# Patient Record
Sex: Female | Born: 1955 | Race: White | Hispanic: No | Marital: Married | Smoking: Never smoker
Health system: Southern US, Community
[De-identification: ages and names within clinical notes are randomized; demographics above are authoritative.]

## PROBLEM LIST (undated history)

## (undated) DIAGNOSIS — E785 Hyperlipidemia, unspecified: Secondary | ICD-10-CM

## (undated) DIAGNOSIS — K449 Diaphragmatic hernia without obstruction or gangrene: Secondary | ICD-10-CM

## (undated) HISTORY — DX: Hyperlipidemia, unspecified: E78.5

## (undated) HISTORY — DX: Diaphragmatic hernia without obstruction or gangrene: K44.9

## (undated) HISTORY — PX: BREAST CYST ASPIRATION: SHX578

---

## 1996-11-17 HISTORY — PX: REDUCTION MAMMAPLASTY: SUR839

## 2004-11-17 HISTORY — PX: ABDOMINAL HYSTERECTOMY: SHX81

## 2005-05-14 ENCOUNTER — Ambulatory Visit: Payer: Self-pay | Admitting: Family Medicine

## 2006-02-11 ENCOUNTER — Other Ambulatory Visit: Payer: Self-pay

## 2006-02-16 ENCOUNTER — Inpatient Hospital Stay: Payer: Self-pay | Admitting: Obstetrics and Gynecology

## 2006-05-21 ENCOUNTER — Ambulatory Visit: Payer: Self-pay | Admitting: Family Medicine

## 2006-09-18 ENCOUNTER — Ambulatory Visit: Payer: Self-pay | Admitting: Gastroenterology

## 2007-05-25 ENCOUNTER — Ambulatory Visit: Payer: Self-pay | Admitting: Obstetrics and Gynecology

## 2007-10-12 ENCOUNTER — Ambulatory Visit: Payer: Self-pay | Admitting: Internal Medicine

## 2008-06-07 ENCOUNTER — Ambulatory Visit: Payer: Self-pay | Admitting: Obstetrics and Gynecology

## 2009-09-24 ENCOUNTER — Ambulatory Visit: Payer: Self-pay | Admitting: Internal Medicine

## 2009-10-02 ENCOUNTER — Ambulatory Visit: Payer: Self-pay | Admitting: Internal Medicine

## 2009-12-20 ENCOUNTER — Ambulatory Visit: Payer: Self-pay | Admitting: Internal Medicine

## 2009-12-25 ENCOUNTER — Ambulatory Visit: Payer: Self-pay

## 2010-09-04 ENCOUNTER — Ambulatory Visit: Payer: Self-pay | Admitting: Internal Medicine

## 2010-09-17 ENCOUNTER — Ambulatory Visit: Payer: Self-pay | Admitting: Internal Medicine

## 2010-10-17 ENCOUNTER — Ambulatory Visit: Payer: Self-pay | Admitting: Internal Medicine

## 2011-01-22 ENCOUNTER — Ambulatory Visit: Payer: Self-pay | Admitting: Obstetrics and Gynecology

## 2012-01-26 ENCOUNTER — Ambulatory Visit: Payer: Self-pay

## 2012-03-04 ENCOUNTER — Ambulatory Visit: Payer: Self-pay | Admitting: Gastroenterology

## 2013-02-16 ENCOUNTER — Ambulatory Visit: Payer: Self-pay

## 2014-02-27 ENCOUNTER — Ambulatory Visit: Payer: Self-pay

## 2015-03-01 ENCOUNTER — Ambulatory Visit
Admit: 2015-03-01 | Disposition: A | Discharge status: Home / Self Care | Payer: Self-pay | Attending: Obstetrics and Gynecology | Admitting: Obstetrics and Gynecology

## 2016-03-03 ENCOUNTER — Other Ambulatory Visit: Payer: Self-pay | Admitting: Obstetrics and Gynecology

## 2016-03-03 DIAGNOSIS — Z1231 Encounter for screening mammogram for malignant neoplasm of breast: Secondary | ICD-10-CM

## 2016-03-07 ENCOUNTER — Ambulatory Visit
Admission: RE | Admit: 2016-03-07 | Discharge: 2016-03-07 | Disposition: A | Discharge status: Home / Self Care | Payer: BC Managed Care – PPO | Source: Ambulatory Visit | Attending: Obstetrics and Gynecology | Admitting: Obstetrics and Gynecology

## 2016-03-07 DIAGNOSIS — Z1231 Encounter for screening mammogram for malignant neoplasm of breast: Secondary | ICD-10-CM | POA: Insufficient documentation

## 2016-03-12 ENCOUNTER — Other Ambulatory Visit: Payer: Self-pay | Admitting: Obstetrics and Gynecology

## 2016-03-12 DIAGNOSIS — R928 Other abnormal and inconclusive findings on diagnostic imaging of breast: Secondary | ICD-10-CM

## 2016-03-18 ENCOUNTER — Ambulatory Visit
Admission: RE | Admit: 2016-03-18 | Discharge: 2016-03-18 | Disposition: A | Discharge status: Home / Self Care | Payer: BC Managed Care – PPO | Source: Ambulatory Visit | Attending: Obstetrics and Gynecology | Admitting: Obstetrics and Gynecology

## 2016-03-18 DIAGNOSIS — R928 Other abnormal and inconclusive findings on diagnostic imaging of breast: Secondary | ICD-10-CM | POA: Insufficient documentation

## 2017-05-07 ENCOUNTER — Other Ambulatory Visit: Payer: Self-pay | Admitting: Obstetrics and Gynecology

## 2017-05-07 ENCOUNTER — Other Ambulatory Visit: Payer: Self-pay | Admitting: Family Medicine

## 2017-05-07 DIAGNOSIS — Z1231 Encounter for screening mammogram for malignant neoplasm of breast: Secondary | ICD-10-CM

## 2017-05-27 ENCOUNTER — Ambulatory Visit
Admission: RE | Admit: 2017-05-27 | Discharge: 2017-05-27 | Disposition: A | Discharge status: Home / Self Care | Payer: BC Managed Care – PPO | Source: Ambulatory Visit | Attending: Obstetrics and Gynecology | Admitting: Obstetrics and Gynecology

## 2017-05-27 DIAGNOSIS — Z1231 Encounter for screening mammogram for malignant neoplasm of breast: Secondary | ICD-10-CM | POA: Diagnosis present

## 2018-07-02 ENCOUNTER — Other Ambulatory Visit: Payer: Self-pay | Admitting: Internal Medicine

## 2018-07-02 DIAGNOSIS — Z1231 Encounter for screening mammogram for malignant neoplasm of breast: Secondary | ICD-10-CM

## 2018-07-06 ENCOUNTER — Encounter: Payer: Self-pay | Admitting: Dietician

## 2018-07-06 ENCOUNTER — Encounter: Payer: BC Managed Care – PPO | Attending: "Endocrinology | Admitting: Dietician

## 2018-07-06 VITALS — BP 106/68 | Ht 64.0 in | Wt 137.8 lb

## 2018-07-06 DIAGNOSIS — E109 Type 1 diabetes mellitus without complications: Secondary | ICD-10-CM

## 2018-07-06 DIAGNOSIS — Z713 Dietary counseling and surveillance: Secondary | ICD-10-CM | POA: Insufficient documentation

## 2018-07-06 DIAGNOSIS — E1065 Type 1 diabetes mellitus with hyperglycemia: Secondary | ICD-10-CM | POA: Diagnosis not present

## 2018-07-06 NOTE — Progress Notes (Signed)
Diabetes Self-Management Education  Visit Type: First/Initial  Appt. Start Time: 1315 Appt. End Time: 1445  07/06/2018  [name], identified by name and date of birth, is a 62 y.o. female with a diagnosis of Diabetes: Type 1.   ASSESSMENT  Blood pressure 106/68, height 5\' 4"  (1.626 m), weight 137 lb 12.8 oz (62.5 kg). Body mass index is 23.65 kg/m.  Diabetes Self-Management Education - 07/06/18 1519      Visit Information   Visit Type  First/Initial      Initial Visit   Diabetes Type  Type 1      Health Coping   How would you rate your overall health?  Poor      Psychosocial Assessment   Patient Belief/Attitude about Diabetes  Motivated to manage diabetes    Self-care barriers  None    Self-management support  Doctor's office;Family    Other persons present  Patient    Patient Concerns  Glycemic Control;Medication   prevent complications   Special Needs  None    Preferred Learning Style  Visual;Auditory    What is the last grade level you completed in school?  college      Pre-Education Assessment   Patient understands the diabetes disease and treatment process.  Demonstrates understanding / competency    Patient understands incorporating nutritional management into lifestyle.  Needs Review    Patient undertands incorporating physical activity into lifestyle.  Needs Review    Patient understands using medications safely.  Needs Review    Patient understands monitoring blood glucose, interpreting and using results  Needs Review    Patient understands prevention, detection, and treatment of acute complications.  Needs Review    Patient understands prevention, detection, and treatment of chronic complications.  Needs Review    Patient understands how to develop strategies to address psychosocial issues.  Needs Review    Patient understands how to develop strategies to promote health/change behavior.  Needs Review      Complications   Last HgB A1C per  patient/outside source  10.9 %   07-05-18   How often do you check your blood sugar?  > 4 times/day    Fasting Blood glucose range (mg/dL)  >161;096-045;409-811;<91;47-829>200;180-200;130-179;<70;70-129    Postprandial Blood glucose range (mg/dL)  562-130;>865;784-696;29-528180-200;>200;130-179;70-129    Have you had a dilated eye exam in the past 12 months?  No   04-2017   Have you had a dental exam in the past 12 months?  No   over 10 years ago   Are you checking your feet?  No      Dietary Intake   Breakfast  eats breakfast at 9a-10a=cold cereal with milk or  egg, toast and vegetables    Snack (morning)  none    Lunch  eats lunch at 12p-1:30p; occasionally eats low carb meals and at times no protein-eats sweets 2-3x/wk    Snack (afternoon)  none    Dinner  eats supper at 6p-6:30p; occasionally eats low carb meals and no protein    Snack (evening)  eats pretzels, cashews at 9p    Beverage(s)  drinks water 4-5x/day and diet sodas 2-3x/day      Exercise   Exercise Type  Light (walking / raking leaves)   walks, line dances and gardens   How many days per week to you exercise?  2.5    How many minutes per day do you exercise?  30    Total minutes per week of exercise  75  Patient Education   Previous Diabetes Education  Yes (please comment)    Disease state   Explored patient's options for treatment of their diabetes;Definition of diabetes, type 1 and 2, and the diagnosis of diabetes    Nutrition management   Role of diet in the treatment of diabetes and the relationship between the three main macronutrients and blood glucose level;Food label reading, portion sizes and measuring food.;Carbohydrate counting;Meal timing in regards to the patients' current diabetes medication.    Physical activity and exercise   Role of exercise on diabetes management, blood pressure control and cardiac health.;Helped patient identify appropriate exercises in relation to his/her diabetes, diabetes complications and other health issue.    Medications   Taught/reviewed insulin injection, site rotation, insulin storage and needle disposal.;Reviewed patients medication for diabetes, action, purpose, timing of dose and side effects.   reviewed use of Treshiba and Novolog and use of sliding scale   Monitoring  Purpose and frequency of SMBG.;Taught/discussed recording of test results and interpretation of SMBG.;Identified appropriate SMBG and/or A1C goals.;Yearly dilated eye exam    Acute complications  Taught treatment of hypoglycemia - the 15 rule.;Discussed and identified patients' treatment of hyperglycemia.    Chronic complications  Relationship between chronic complications and blood glucose control;Retinopathy and reason for yearly dilated eye exams;Dental care;Nephropathy, what it is, prevention of, the use of ACE, ARB's and early detection of through urine microalbumia.;Reviewed with patient heart disease, higher risk of, and prevention;Lipid levels, blood glucose control and heart disease    Personal strategies to promote health  Lifestyle issues that need to be addressed for better diabetes care;Helped patient develop diabetes management plan for (enter comment)      Outcomes   Expected Outcomes  Demonstrated interest in learning. Expect positive outcomes       Individualized Plan for Diabetes Self-Management Training:   Learning Objective:  Patient will have a greater understanding of diabetes self-management. Patient education plan is to attend individual and/or group sessions per assessed needs and concerns.   Plan:   Patient Instructions   Check blood sugars 4 x day before each meal and before bed every day and occasional 2-3a with Libre  Bring blood sugar records to the next appointment/class  Exercise: walk/line dance 30 min. 3-4x/wk-only if BG <250  Eat 3 meals day and 1 small   snack a day at bedtime at bedtime if BG<150  Eat 2-3 carbohydrate servings/meal + protein  Eat 1 carbohydrate serving/snack + protein  Space  meals 4-5 hours apart  Avoid sugar sweetened drinks (soda, tea, coffee, sports drinks, juices)  Drink plenty of water  Limit intake of sweets/desserts, snack foods and fried foods  Complete 3 Day Food Record and bring to next appt-estimate carbohydrate grams  Make dentist / eye doctor appointments  Get a Sharps container  Carry fast acting glucose and a snack at all times  Carry medical alert ID at all times  Take Novolog before meals and eat in 15 min.   Rotate injection sites  Return for appointment/classes on:  07-21-18   Expected Outcomes:  Demonstrated interest in learning. Expect positive outcomes  Education material provided: General meal planning guidelines, Food Group handout, high and low BG handouts, medical alert ID card and coupon  If problems or questions, patient to contact team via: [phone]  Future DSME appointment:  07-21-18

## 2018-07-06 NOTE — Patient Instructions (Addendum)
  Check blood sugars 4 x day before each meal and before bed every day and occasional 2-3a with Libre  Bring blood sugar records to the next appointment/class  Exercise: walk/line dance 30 min. 3-4x/wk-only if BG <250  Eat 3 meals day and 1 small   snack a day at bedtime if BG<150  Eat 2-3 carbohydrate servings/meal + protein  Eat 1 carbohydrate serving/snack + protein  Space meals 4-5 hours apart  Avoid sugar sweetened drinks (soda, tea, coffee, sports drinks, juices)  Drink plenty of water  Limit intake of sweets/desserts, snack foods and fried foods  Complete 3 Day Food Record and bring to next appt-estimate carbohydrate grams  Make dentist / eye doctor appointments  Get a Sharps container  Carry fast acting glucose and a snack at all times  Carry medical alert ID at all times  Take Novolog before meals and eat in 15 min.  Rotate injection sites  Return for appointment/classes on:  07-21-18

## 2018-07-12 ENCOUNTER — Encounter: Payer: Self-pay | Admitting: Dietician

## 2018-07-12 NOTE — Progress Notes (Signed)
Called pt for followup-pt reports BG's have been slightly improved since last visit; however, she occasionally does not take her Novolog  when she is eating out with someone because she doesn't want to take the injection in front of anyone and does not want to go to bathroom  to take the injection-she then takes  the Novolog after she eats resulting in a high BG. Encouraged pt to take the Novolog + sliding scale if needed 15 min. before she eats if possible to promote BG control

## 2018-07-21 ENCOUNTER — Encounter: Payer: Self-pay | Admitting: Dietician

## 2018-07-21 ENCOUNTER — Encounter: Payer: BC Managed Care – PPO | Attending: "Endocrinology | Admitting: Dietician

## 2018-07-21 VITALS — Ht 64.0 in | Wt 139.8 lb

## 2018-07-21 DIAGNOSIS — Z713 Dietary counseling and surveillance: Secondary | ICD-10-CM | POA: Insufficient documentation

## 2018-07-21 DIAGNOSIS — E109 Type 1 diabetes mellitus without complications: Secondary | ICD-10-CM

## 2018-07-21 DIAGNOSIS — E1065 Type 1 diabetes mellitus with hyperglycemia: Secondary | ICD-10-CM | POA: Insufficient documentation

## 2018-07-21 NOTE — Patient Instructions (Signed)
   Keep a food diary and practice estimating carb grams.   Aim for 30-45grams of carbohydrate with each meal. Consistent carb intake will narrow down blood sugar fluctuations.

## 2018-07-21 NOTE — Progress Notes (Signed)
Diabetes Self-Management Education  Visit Type:  Follow-up  Appt. Start Time: 1030 Appt. End Time: 1140  07/21/2018  [name], identified by name and date of birth, is a 62 y.o. female with a diagnosis of Diabetes: Type 1.   ASSESSMENT  Height 5\' 4"  (1.626 m), weight 139 lb 12.8 oz (63.4 kg). Body mass index is 24 kg/m.   Diabetes Self-Management Education - 07/21/18 1038      Complications   How often do you check your blood sugar?  > 4 times/day    Fasting Blood glucose range (mg/dL)  87-867;672-094;709-628;>366   lowest 52; <100-300   Postprandial Blood glucose range (mg/dL)  294-765;465-035;>465    Number of hypoglycemic episodes per month  2    Can you tell when your blood sugar is low?  Yes    What do you do if your blood sugar is low?  glucose tablets; has glucagon    Have you had a dilated eye exam in the past 12 months?  Yes    Have you had a dental exam in the past 12 months?  No    Are you checking your feet?  No      Dietary Intake   Breakfast  3 meals and 0-2 snacks daily; eating sporadically at times due to caring for ill mother.       Exercise   Exercise Type  Light (walking / raking leaves)    How many days per week to you exercise?  2.5    How many minutes per day do you exercise?  30    Total minutes per week of exercise  75      Patient Education   Disease state   Definition of diabetes, type 1 and 2, and the diagnosis of diabetes    Nutrition management   Food label reading, portion sizes and measuring food.;Carbohydrate counting;Other (comment)    Monitoring  Taught/discussed recording of test results and interpretation of SMBG.       Learning Objective:  Patient will have a greater understanding of diabetes self-management. Patient education plan is to attend individual and/or group sessions per assessed needs and concerns.  Patient reports widely fluctuating BGs, and somewhat erratic eating pattern, due to providing care for her mother in  ill health, among other responsibilities. Instructed on advanced carb counting and practiced using patient's food diary. Encouraged use of food labels, written and online resources to aid in carb counting. Advised patient to continue keeping a food diary and to practice estimating carb intake. Advised consistent carb intake for now to help improve BG fluctuation.   Plan:   Keep a food diary and practice estimating carb grams.   Aim for 30-45grams of carbohydrate with each meal. Consistent carb intake will narrow down blood sugar fluctuations.     Education material provided: [employer] book); Diabetes [employer] (Novo)     If problems or questions, patient to contact team via:  Phone and Email  Future DSME appointment: -

## 2018-08-09 ENCOUNTER — Encounter: Payer: Self-pay | Admitting: Dietician

## 2018-08-09 ENCOUNTER — Encounter: Payer: BC Managed Care – PPO | Admitting: Dietician

## 2018-08-09 VITALS — BP 118/70 | Wt 144.3 lb

## 2018-08-09 DIAGNOSIS — E109 Type 1 diabetes mellitus without complications: Secondary | ICD-10-CM

## 2018-08-09 DIAGNOSIS — Z713 Dietary counseling and surveillance: Secondary | ICD-10-CM | POA: Diagnosis not present

## 2018-08-09 NOTE — Patient Instructions (Addendum)
Scan with Josephine IgoLibre  before meals and at bedtime and  2 hr after meals Exercise: continue regular exercise- line dancing  2 hr 2x/wk and walking 30 min. 3x/wk Calculate carbohydrates as accurate as possible-eat 30-45 grams carbs at each meal + protein Take correction as instructed by MD Take Novolog dose 15-20 min. before eating when possible Drink plenty of water Make a dentist appointment Get a Sharps container Carry fast acting glucose and a snack at all times Rotate injection sites Call [facility] with update in 1 week Return for appointment/classes on: call if desires to schedule FU appointment

## 2018-08-09 NOTE — Progress Notes (Signed)
Diabetes Self-Management Education  Visit Type:  Follow-up  Appt. Start Time: 1330 Appt. End Time: 1500  08/09/2018  [name], identified by name and date of birth, is a 62 y.o. female with a diagnosis of Diabetes:  .   ASSESSMENT  Blood pressure 118/70, weight 144 lb 4.8 oz (65.5 kg). Body mass index is 24.77 kg/m.   Diabetes Self-Management Education - 08/09/18 1752      Complications   How often do you check your blood sugar?  > 4 times/day    Fasting Blood glucose range (mg/dL)  >161;09-604;540-981;191-478;<29>200;70-129;130-179;180-200;<70    Postprandial Blood glucose range (mg/dL)  >562;13-086;578-469;629-528>200;70-129;130-179;180-200    Have you had a dilated eye exam in the past 12 months?  Yes    Have you had a dental exam in the past 12 months?  No    Are you checking your feet?  Yes    How many days per week are you checking your feet?  7      Dietary Intake   Breakfast  eats 3 meals/day and bedtime snack (usually nuts)      Exercise   Exercise Type  Light (walking / raking leaves);Moderate (swimming / aerobic walking)   line dances 2 hr 2x/wk and walks 30 min. 3x/wk   How many days per week to you exercise?  5    How many minutes per day do you exercise?  66    Total minutes per week of exercise  330      Patient Education   Medications  Reviewed patients medication for diabetes, action, purpose, timing of dose and side effects.;Reviewed medication adjustment guidelines for hyperglycemia and sick days.;Taught/reviewed insulin injection, site rotation, insulin storage and needle disposal.   pt reports taking Novolog boluses after eating meals-reviewed use of Novolog along with sliding scale given by MD and discussed use of an ICR for food boluses-practiced using ICR to estimate food bolus;   Monitoring  Purpose and frequency of SMBG.;Yearly dilated eye exam;Identified appropriate SMBG and/or A1C goals.;Daily foot exams;Taught/discussed recording of test results and interpretation of SMBG.;Interpreting lab values -  A1C, lipid, urine microalbumina.;Ketone testing, when, how.   pt using Libre sensor; reports having some low BG's at 3a (50's-70's) even when bedtime BG 279, 301 and did not take correction or eat a bedtime snack with insulin (may need Tresiba dose reduced)   Acute complications  Taught treatment of hypoglycemia - the 15 rule.;Covered sick day management with medication and food.;Discussed and identified patients' treatment of hyperglycemia.;Trained/discussed glucagon administration to patient and designated other.    Chronic complications  Relationship between chronic complications and blood glucose control;Lipid levels, blood glucose control and heart disease;Dental care;Nephropathy, what it is, prevention of, the use of ACE, ARB's and early detection of through urine microalbumia.;Reviewed with patient heart disease, higher risk of, and prevention;Retinopathy and reason for yearly dilated eye exams;Assessed and discussed foot care and prevention of foot problems;Applicable immunizations    Psychosocial adjustment  Role of stress on diabetes;Helped patient identify a support system for diabetes management;Identified and addressed patients feelings and concerns about diabetes;Brainstormed with patient on coping mechanisms for social situations, getting support from significant others, dealing with feelings about diabetes;Worked with patient to identify barriers to care and solutions    Personal strategies to promote health  Lifestyle issues that need to be addressed for better diabetes care;Helped patient develop diabetes management plan for (enter comment)       Learning Objective:  Patient will have a greater  understanding of diabetes self-management. Patient education plan is to attend individual and/or group sessions per assessed needs and concerns.   Plan:   Patient Instructions  Scan with Amy Norman before meals and at bedtime and  2 hr after meals Exercise: continue regular exercise- line dancing   2 hr 2x/wk and walking 30 min. 3x/wk Calculate carbohydrates as accurate as possible-eat 30-45 grams carbs at each meal + protein Take correction dose as instructed by MD Take Novolog dose 15-20 min. before eating when possible Drink plenty of water Make a dentist appointment Get a Sharps container Carry fast acting glucose and a snack at all times Rotate injection sites Call [facility] with update in 1 week or if questions/problems arise Return for appointment on: call if desires to schedule FU appointment    Expected Outcomes:   positive  Education material provided: Living Well With Diabetes booklet, A1C handout, Kidney test handout, Depression/Stress management handout, Sick Day Checklist, Foot care handout  If problems or questions, patient to contact team via:  [phone]  Future DSME appointment:call if desires to schedule FU

## 2018-08-16 ENCOUNTER — Encounter: Payer: Self-pay | Admitting: Dietician

## 2018-08-16 NOTE — Progress Notes (Signed)
Have not heard from pt-called pt but no answer-left message for pt to call me with update on BG's

## 2018-09-13 ENCOUNTER — Encounter: Payer: Self-pay | Admitting: Dietician

## 2018-09-13 NOTE — Progress Notes (Signed)
Called pt on 08-30-18 and no answer-left message for pt to call me. No response from pt

## 2018-11-16 ENCOUNTER — Other Ambulatory Visit: Payer: Self-pay | Admitting: Internal Medicine

## 2018-11-16 DIAGNOSIS — Z1231 Encounter for screening mammogram for malignant neoplasm of breast: Secondary | ICD-10-CM

## 2019-12-13 ENCOUNTER — Other Ambulatory Visit: Payer: Self-pay | Admitting: Internal Medicine

## 2019-12-13 DIAGNOSIS — Z1231 Encounter for screening mammogram for malignant neoplasm of breast: Secondary | ICD-10-CM

## 2019-12-14 ENCOUNTER — Ambulatory Visit
Admission: RE | Admit: 2019-12-14 | Discharge: 2019-12-14 | Disposition: A | Discharge status: Home / Self Care | Payer: BC Managed Care – PPO | Source: Ambulatory Visit | Attending: Internal Medicine | Admitting: Internal Medicine

## 2019-12-14 DIAGNOSIS — Z1231 Encounter for screening mammogram for malignant neoplasm of breast: Secondary | ICD-10-CM | POA: Diagnosis present

## 2020-02-12 ENCOUNTER — Ambulatory Visit: Payer: BC Managed Care – PPO | Attending: Internal Medicine

## 2020-02-12 DIAGNOSIS — Z23 Encounter for immunization: Secondary | ICD-10-CM

## 2020-02-12 NOTE — Progress Notes (Signed)
   Covid-19 Vaccination Clinic  Name:  Amy Norman    MRN: [id number] DOB: 16-Nov-1956  02/12/2020  [name] was observed post Covid-19 immunization for 15 minutes without incident. She was provided with Vaccine Information Sheet and instruction to access the V-Safe system.   [name] was instructed to call 911 with any severe reactions post vaccine: Marland Kitchen Difficulty breathing  . Swelling of face and throat  . A fast heartbeat  . A bad rash all over body  . Dizziness and weakness   Immunizations Administered    Name Date Dose VIS Date Route   Pfizer COVID-19 Vaccine 02/12/2020  5:03 PM 0.3 mL 10/28/2019 Intramuscular   Manufacturer: [employer], [employer]   Lot: [id number]   NDC: [address]-4

## 2020-03-09 ENCOUNTER — Ambulatory Visit: Payer: BC Managed Care – PPO | Attending: Oncology

## 2020-03-09 DIAGNOSIS — Z23 Encounter for immunization: Secondary | ICD-10-CM

## 2020-03-09 NOTE — Progress Notes (Signed)
   Covid-19 Vaccination Clinic  Name:  Amy Norman    MRN: [id number] DOB: 1956/04/23  03/09/2020  Ms. Swallow was observed post Covid-19 immunization for 15 minutes without incident. She was provided with Vaccine Information Sheet and instruction to access the V-Safe system.   [name] was instructed to call 911 with any severe reactions post vaccine: Marland Kitchen Difficulty breathing  . Swelling of face and throat  . A fast heartbeat  . A bad rash all over body  . Dizziness and weakness   Immunizations Administered    Name Date Dose VIS Date Route   Pfizer COVID-19 Vaccine 03/09/2020  3:12 PM 0.3 mL 01/11/2019 Intramuscular   Manufacturer: [employer], [employer]   Lot: [id number]   NDC: [address]-8

## 2020-08-14 ENCOUNTER — Encounter: Payer: Self-pay | Admitting: Physical Therapy

## 2020-08-14 ENCOUNTER — Ambulatory Visit: Payer: BC Managed Care – PPO | Attending: Obstetrics and Gynecology | Admitting: Physical Therapy

## 2020-08-14 ENCOUNTER — Other Ambulatory Visit: Payer: Self-pay

## 2020-08-14 DIAGNOSIS — R278 Other lack of coordination: Secondary | ICD-10-CM | POA: Insufficient documentation

## 2020-08-14 DIAGNOSIS — M6281 Muscle weakness (generalized): Secondary | ICD-10-CM | POA: Insufficient documentation

## 2020-08-14 NOTE — Therapy (Signed)
[facility] [address] [place], [place], [address] Phone: [phone]   Fax:  [phone]  Physical Therapy Evaluation  Patient Details  Name: Amy Norman MRN: [id number] Date of Birth: September 10, 1956 Referring Provider (PT): McVey, [place]   Encounter Date: 08/14/2020   PT End of Session - 08/14/20 1437    Visit Number 1    Number of Visits 8    Date for PT Re-Evaluation 10/09/20    PT Start Time 1000    PT Stop Time 1055    PT Time Calculation (min) 55 min    Activity Tolerance Patient tolerated treatment well    Behavior During Therapy [facility] for tasks assessed/performed           Past Medical History:  Diagnosis Date  . Hiatal hernia   . Hiatal hernia   . Hyperlipidemia     Past Surgical History:  Procedure Laterality Date  . ABDOMINAL HYSTERECTOMY  2006  . BREAST CYST ASPIRATION Bilateral    Negative  . REDUCTION MAMMAPLASTY Bilateral 1998    There were no vitals filed for this visit.        [facility] PT Assessment - 08/14/20 0001      Assessment   Medical Diagnosis Cystocele    Referring Provider (PT) McVey, R    Prior Therapy None      Balance Screen   Has the patient fallen in the past 6 months No           PELVIC HEALTH PHYSICAL THERAPY EVALUATION  SCREENING Red Flags: None Have you had any night sweats? Unexplained weight loss? Saddle anesthesia?  Unexplained changes in bowel or bladder habits?  Precautions: None  SUBJECTIVE  Chief Complaint: Patient states that she started to have increased UI after hysterectomy. Patient wears a pad all the time including sleeping because of leakage. Patient notes she has decreased ability to delay urination. Patient also notes a heaviness in the pelvis as well as at times feeling as though something is dropping toward the vaginal opening. Patient notes that she consistently feels something at the entrance to her vagina when performing hygiene/bathing. Patient  denies any tenderness. Patient also has lichens sclerosus. Recent GYN appointment positive for yeast infection. Patient notes history of diverticulitis as well, but that has resolved. Patient also has lumbar pain with gardening. Patient does note increased burning after GYN exam at last visit.  Pertinent History:  Falls Negative.  Scoliosis Negative. Pulmonary disease/dysfunction Negative. Surgical history: Positive for see above.   Obstetrical History: G2P1 Deliveries: vaginal Tearing/Episiotomy: episiotomy Birthing position: back  Gynecological History: Hysterectomy: Yes Abdominal Endometriosis: Positive Pain with exam: Yes (historically)  Urinary History: Incontinence: Positive. Onset: 2006 Triggers: urgency, coughing/laughing, running water. Amount: Min/Mod  Fluid Intake: 80-100 oz H20, 1 cup of coffee caffeinated Nocturia: 2-3x/night Frequency of urination: every 2-3 hours Pain with urination: Negative Difficulty initiating urination: Negative Frequent UTI: Positive.   Gastrointestinal History: [place] Stool Chart: Type 4/5/6 Frequency of BMs: 1-2x/day Pain with defecation: Negative Straining with defecation: Negative Incontinence: Negative.   Sexual activity/pain: Pain with intercourse: Negative.   Initial penetration: No  Deep thrustingNo   Location of pain: LLQ Current pain:  0/10  Max pain:  3-4/10 Least pain:  0/10 Pain quality: pain quality: aching Radiating pain: No    Patient assessment of present state: "the bladder is so low now there's nothing there to control"  Current activities:  Gardening; antiquing  Patient Goals:  Decrease the  need to use the poise pads  Patient perception of overall health: Good  OBJECTIVE  Mental Status Patient is oriented to person, place and time.  Recent memory is intact.  Remote memory is intact.  Attention span and concentration are intact.  Expressive speech is intact.  Patient's fund of knowledge is  within normal limits for educational level.  POSTURE/OBSERVATIONS:  Lumbar lordosis: WNL Iliac crest height: R appearing elevated Lumbar lateral shift: negative  GAIT: L lateral trunk flexion during L stance phase of gait, slight lilting throughout.  Trendelenburg R: Positive L: Positive  RANGE OF MOTION: deferred 2/2 to time constraints   LEFT RIGHT  Lumbar forward flexion (65):      Lumbar extension (30):     Lumbar lateral flexion (25):     Thoracic and Lumbar rotation (30 degrees):       Hip Flexion (0-125):      Hip IR (0-45):     Hip ER (0-45):     Hip Abduction (0-40):     Hip extension (0-15):      SENSATION: deferred 2/2 to time constraints   STRENGTH: MMT deferred 2/2 to time constraints  RLE LLE  Hip Flexion    Hip Extension    Hip Abduction     Hip Adduction     Hip ER     Hip IR     Knee Extension    Knee Flexion    Dorsiflexion     Plantarflexion (seated)     ABDOMINAL: deferred 2/2 to time constraints Palpation: Diastasis: Scar mobility: Rib flare:  SPECIAL TESTS: deferred 2/2 to time constraints   PHYSICAL PERFORMANCE MEASURES: STS: WNL   EXTERNAL PELVIC EXAM: deferred 2/2 to time constraints Palpation: Breath coordination: Cued Lengthen: Cued Contraction: Cough:  INTERNAL VAGINAL EXAM: deferred 2/2 to time constraints Introitus Appears:  Skin integrity:  Scar mobility: Strength (PERF):  Symmetry: Palpation: Prolapse:   OUTCOME MEASURES: FOTO (PFDI Pain 17, Urinary 41)   ASSESSMENT Patient is a 64 year old presenting to clinic with chief complaints of cystocele and UI. Upon examination, patient demonstrates deficits in PFM strength, PFM coordination, IAP management, posture, balance, and gait as evidenced by lilting gait with L lateral trunk flexion during L stance phase, (+) B Trendelenburg signs, R elevated IC, UI with coughing/sneezing, UI with urgency components, palpable tissue at vaginal opening. Patient's responses on  FOTO outcome measures (PFDI Pain 17, Urinary 41) indicate significant functional limitations/disability/distress. Patient's progress may be limited due to chronicity of complaint and level of cystocele; however, patient's motivation is advantageous. Patient was able to achieve basic understanding of pelvic floor muscle functions during today's evaluation and responded positively to educational interventions. Patient will benefit from continued skilled therapeutic intervention to address deficits in PFM strength, PFM coordination, IAP management, posture, balance, and gait in order to increase function and improve overall QOL.  EDUCATION Patient educated on prognosis, POC, and provided with HEP including: bladder diary. Patient articulated understanding and returned demonstration. Patient will benefit from further education in order to maximize compliance and understanding for long-term therapeutic gains.  TREATMENT  Neuromuscular Re-education: Patient educated on primary functions of the pelvic floor including: posture/balance, sexual pleasure, storage and elimination of waste from the body, abdominal cavity closure, and breath coordination.       Objective measurements completed on examination: See above findings.          PT Long Term Goals - 08/14/20 1746      PT LONG TERM GOAL #  1   Title Patient will demonstrate independence with HEP in order to maximize therapeutic gains and improve carryover from physical therapy sessions to ADLs in the home and community.    Baseline IE: not initiated    Time 8    Period Weeks    Status New    Target Date 10/09/20      PT LONG TERM GOAL #2   Title Patient will demonstrate circumferential and sequential contraction of >4/5 MMT, > 6 sec hold x10 and 5 consecutive quick flicks with </= 10 min rest between testing bouts, and relaxation of the PFM coordinated with breath for improved management of intra-abdominal pressure and normal bowel and  bladder function without the presence of pain nor incontinence in order to improve participation at home and in the community.    Baseline IE: not demonstrated    Time 8    Period Weeks    Status New    Target Date 10/09/20      PT LONG TERM GOAL #3   Title Patient will report decreased urinary pad usage to less than 2/day in order to demonstrate improved PFM coordination, strength, and function for improved overall QOL.    Baseline IE: 4/day    Time 8    Period Weeks    Status New    Target Date 10/09/20      PT LONG TERM GOAL #4   Title Patient will demonstrate improved function as evidenced by a score of 55 on FOTO measure for full participation in activities at home and in the community.    Baseline IE: 41    Time 8    Period Weeks    Status New    Target Date 10/09/20                  Plan - 08/14/20 1437    Clinical Impression Statement Patient is a 64 year old presenting to clinic with chief complaints of cystocele and UI. Upon examination, patient demonstrates deficits in PFM strength, PFM coordination, IAP management, posture, balance, and gait as evidenced by lilting gait with L lateral trunk flexion during L stance phase, (+) B Trendelenburg signs, R elevated IC, UI with coughing/sneezing, UI with urgency components, palpable tissue at vaginal opening. Patient's responses on FOTO outcome measures (PFDI Pain 17, Urinary 41) indicate significant functional limitations/disability/distress. Patient's progress may be limited due to chronicity of complaint and level of cystocele; however, patient's motivation is advantageous. Patient was able to achieve basic understanding of pelvic floor muscle functions during today's evaluation and responded positively to educational interventions. Patient will benefit from continued skilled therapeutic intervention to address deficits in PFM strength, PFM coordination, IAP management, posture, balance, and gait in order to increase function  and improve overall QOL.    Personal Factors and Comorbidities Age;Past/Current Experience;Time since onset of injury/illness/exacerbation;Comorbidity 3+    Comorbidities HLD, DM I, HTN, cystocele, lichen sclerosis, anxiety, depression, migraines    Examination-Activity Limitations Continence;Lift;Squat;Sleep;Bend    [employer] CommunicationsExamination-Participation Restrictions Community Activity;Shop;Other;Yard Work    Conservation officer, historic buildingstability/Clinical Decision Making Evolving/Moderate complexity    Clinical Decision Making Moderate    Rehab Potential Fair    PT Frequency 1x / week    PT Duration 8 weeks    PT Treatment/Interventions Cryotherapy;Moist Heat;ADLs/Self Care Home Management;Electrical Stimulation;Therapeutic exercise;Neuromuscular re-education;Therapeutic activities;Patient/family education;Manual techniques;Taping;Dry needling;Energy conservation;Compression bandaging;Scar mobilization    PT Next Visit Plan PFM assessment and training    PT Home Exercise Plan bladder diary    Consulted and Agree  with Plan of Care Patient           Patient will benefit from skilled therapeutic intervention in order to improve the following deficits and impairments:  Abnormal gait, Decreased balance, Pain, Postural dysfunction, Obesity, Decreased strength, Decreased coordination, Decreased activity tolerance, Improper body mechanics, Decreased endurance  Visit Diagnosis: Muscle weakness (generalized)  Other lack of coordination     Problem List There are no problems to display for this patient.  Sheria Lang PT, DPT [phone]  08/14/2020, 5:48 PM  [facility] [address] [place], [place], [address] Phone: [phone]   Fax:  [phone]  Name: KASY IANNACONE MRN: [id number] Date of Birth: May 22, 1956

## 2020-08-22 ENCOUNTER — Encounter: Payer: Self-pay | Admitting: Physical Therapy

## 2020-08-22 ENCOUNTER — Other Ambulatory Visit: Payer: Self-pay

## 2020-08-22 ENCOUNTER — Ambulatory Visit: Payer: BC Managed Care – PPO | Attending: Obstetrics and Gynecology | Admitting: Physical Therapy

## 2020-08-22 DIAGNOSIS — R278 Other lack of coordination: Secondary | ICD-10-CM | POA: Insufficient documentation

## 2020-08-22 DIAGNOSIS — M6281 Muscle weakness (generalized): Secondary | ICD-10-CM | POA: Diagnosis not present

## 2020-08-22 NOTE — Therapy (Signed)
[facility] [address] [place], [place], [address] Phone: [phone]   Fax:  [phone]  Physical Therapy Treatment  Patient Details  Name: Amy Norman MRN: [id number] Date of Birth: [birth year] Referring Provider (PT): McVey, [place]   Encounter Date: 08/22/2020   PT End of Session - 08/22/20 1450    Visit Number 2    Number of Visits 8    Date for PT Re-Evaluation 10/09/20    PT Start Time 1448    PT Stop Time 1545    PT Time Calculation (min) 57 min    Activity Tolerance Patient tolerated treatment well    Behavior During Therapy [facility] for tasks assessed/performed           Past Medical History:  Diagnosis Date  . Hiatal hernia   . Hiatal hernia   . Hyperlipidemia     Past Surgical History:  Procedure Laterality Date  . ABDOMINAL HYSTERECTOMY  2006  . BREAST CYST ASPIRATION Bilateral    Negative  . REDUCTION MAMMAPLASTY Bilateral 1998    There were no vitals filed for this visit.   Subjective Assessment - 08/22/20 1449    Subjective Patient presents to clinic with her bladder diary. She notes that she has leakage after prolonged sitting/laying down; the leakage amount varies from minimal to moderate. She reports this occurs more in the evening and first thing in the morning.    Currently in Pain? No/denies          TREATMENT  RANGE OF MOTION:    LEFT RIGHT  Lumbar forward flexion (65):  WNL    Lumbar extension (30): WNL    Lumbar lateral flexion (25):  75% of R WNL  Thoracic and Lumbar rotation (30 degrees):    WNL 75% of L  Hip Flexion (0-125):   WNL WNL  Hip IR (0-45):  WNL WNL  Hip ER (0-45):  WNL WNL  Hip Abduction (0-40):  WNL WNL  Hip extension (0-15):  WNL WNL    STRENGTH: MMT   RLE LLE  Hip Flexion 4 5  Hip Extension 5 5  Hip Abduction  5 5  Hip Adduction  5 5  Hip ER  5 5  Hip IR  5 5  Knee Extension 5 5  Knee Flexion 5 5  Dorsiflexion  5 5  Plantarflexion (seated) 5 5   ABDOMINAL:   Palpation: no TTP Diastasis: WNL Rib flare: none present  EXTERNAL PELVIC EXAM:  Palpation: no TTP Breath coordination: present Cued Lengthen: abdominal and gluteal compensations Cued Contraction: 2/5 MMT Cough: coordinated lift of limited strength  Neuromuscular Re-education: Supine hooklying diaphragmatic breathing with VCs and TCs for downregulation of the nervous system and improved management of IAP Supine hooklying, PFM lengthening with inhalation. VCs and TCs to decrease compensatory patterns and encourage optimal relaxation of the PFM. Supine hooklying, PFM contractions with exhalation. VCs and TCs to decrease compensatory patterns and encourage activation of the PFM. Hip bridge with PFM contraction and coordinated breath. VCs and TCs to decrease compensatory patterns and encourage activation of the PFM. Patient education on log roll technique for IAP management as well as "the knack" for decreased UI with positional changes. Patient education on heel lift, donning/doffing, and appropriate progression for improved pelvic posture.   Patient educated throughout session on appropriate technique and form using multi-modal cueing, HEP, and activity modification. Patient articulated understanding and returned demonstration.  Patient Response to interventions: Comfortable with addition to  HEP.   ASSESSMENT Patient presents to clinic with excellent motivation to participate in therapy. Patient demonstrates deficits in PFM strength, PFM coordination, IAP management, posture, balance, and gait. Patient able to achieve coordinated PFM contraction with hip bridge during today's session and responded positively to educational and active interventions. Patient will benefit from continued skilled therapeutic intervention to address remaining deficits in PFM strength, PFM coordination, IAP management, posture, balance, and gait in order to increase function, and improve overall QOL.    PT Long  Term Goals - 08/14/20 1746      PT LONG TERM GOAL #1   Title Patient will demonstrate independence with HEP in order to maximize therapeutic gains and improve carryover from physical therapy sessions to ADLs in the home and community.    Baseline IE: not initiated    Time 8    Period Weeks    Status New    Target Date 10/09/20      PT LONG TERM GOAL #2   Title Patient will demonstrate circumferential and sequential contraction of >4/5 MMT, > 6 sec hold x10 and 5 consecutive quick flicks with </= 10 min rest between testing bouts, and relaxation of the PFM coordinated with breath for improved management of intra-abdominal pressure and normal bowel and bladder function without the presence of pain nor incontinence in order to improve participation at home and in the community.    Baseline IE: not demonstrated    Time 8    Period Weeks    Status New    Target Date 10/09/20      PT LONG TERM GOAL #3   Title Patient will report decreased urinary pad usage to less than 2/day in order to demonstrate improved PFM coordination, strength, and function for improved overall QOL.    Baseline IE: 4/day    Time 8    Period Weeks    Status New    Target Date 10/09/20      PT LONG TERM GOAL #4   Title Patient will demonstrate improved function as evidenced by a score of 55 on FOTO measure for full participation in activities at home and in the community.    Baseline IE: 41    Time 8    Period Weeks    Status New    Target Date 10/09/20                 Plan - 08/22/20 1450    Clinical Impression Statement Patient presents to clinic with excellent motivation to participate in therapy. Patient demonstrates deficits in PFM strength, PFM coordination, IAP management, posture, balance, and gait. Patient able to achieve coordinated PFM contraction with hip bridge during today's session and responded positively to educational and active interventions. Patient will benefit from continued skilled  therapeutic intervention to address remaining deficits in PFM strength, PFM coordination, IAP management, posture, balance, and gait in order to increase function, and improve overall QOL.    Personal Factors and Comorbidities Age;Past/Current Experience;Time since onset of injury/illness/exacerbation;Comorbidity 3+    Comorbidities HLD, DM I, HTN, cystocele, lichen sclerosis, anxiety, depression, migraines    Examination-Activity Limitations Continence;Lift;Squat;Sleep;Bend    [employer] Activity;Shop;Other;Yard Work    Conservation officer, historic buildings Evolving/Moderate complexity    Rehab Potential Fair    PT Frequency 1x / week    PT Duration 8 weeks    PT Treatment/Interventions Cryotherapy;Moist Heat;ADLs/Self Care Home Management;Electrical Stimulation;Therapeutic exercise;Neuromuscular re-education;Therapeutic activities;Patient/family education;Manual techniques;Taping;Dry needling;Energy conservation;Compression bandaging;Scar mobilization    PT Next Visit  Plan PFM assessment and training    PT Home Exercise Plan bladder diary    Consulted and Agree with Plan of Care Patient           Patient will benefit from skilled therapeutic intervention in order to improve the following deficits and impairments:  Abnormal gait, Decreased balance, Pain, Postural dysfunction, Obesity, Decreased strength, Decreased coordination, Decreased activity tolerance, Improper body mechanics, Decreased endurance  Visit Diagnosis: Muscle weakness (generalized)  Other lack of coordination     Problem List There are no problems to display for this patient.  Sheria Lang PT, DPT [phone]  08/22/2020, 5:29 PM  [facility] [address] [place], [place], [address] Phone: [phone]   Fax:  [phone]  Name: Amy Norman MRN: [id number] Date of Birth: Apr 14, 1956

## 2020-08-23 ENCOUNTER — Encounter: Payer: BC Managed Care – PPO | Admitting: Physical Therapy

## 2020-08-27 ENCOUNTER — Encounter: Payer: Self-pay | Admitting: Physical Therapy

## 2020-08-27 ENCOUNTER — Other Ambulatory Visit: Payer: Self-pay

## 2020-08-27 ENCOUNTER — Ambulatory Visit: Payer: BC Managed Care – PPO | Admitting: Physical Therapy

## 2020-08-27 DIAGNOSIS — M6281 Muscle weakness (generalized): Secondary | ICD-10-CM

## 2020-08-27 DIAGNOSIS — R278 Other lack of coordination: Secondary | ICD-10-CM

## 2020-08-27 NOTE — Therapy (Signed)
[place] [facility] [address] [place], [place], [address] Phone: [phone]   Fax:  [phone]  Physical Therapy Treatment  Patient Details  Name: Amy Norman MRN: [id number] Date of Birth: [birth year] Referring Provider (PT): McVey, [place]   Encounter Date: 08/27/2020   PT End of Session - 08/27/20 0956    Visit Number 3    Number of Visits 8    Date for PT Re-Evaluation 10/09/20    PT Start Time 0955    PT Stop Time 1050    PT Time Calculation (min) 55 min    Activity Tolerance Patient tolerated treatment well    Behavior During Therapy [facility] for tasks assessed/performed           Past Medical History:  Diagnosis Date  . Hiatal hernia   . Hiatal hernia   . Hyperlipidemia     Past Surgical History:  Procedure Laterality Date  . ABDOMINAL HYSTERECTOMY  2006  . BREAST CYST ASPIRATION Bilateral    Negative  . REDUCTION MAMMAPLASTY Bilateral 1998    There were no vitals filed for this visit.   Subjective Assessment - 08/27/20 0958    Subjective Patient notes that she got a chance to try some of her exercises. She notes that she feels comfortable with the exercises and notes that she feels she needs to do many more in order to notice a difference.    Currently in Pain? No/denies          TREATMENT  Neuromuscular Re-education: Patient educated extensively on typical bladder function, typical bladder habits, and strategies for urge suppression in order to better regulate bladder through behavioral changes.    Patient educated throughout session on appropriate technique and form using multi-modal cueing, HEP, and activity modification. Patient articulated understanding and returned demonstration.  Patient Response to interventions: Comfortable with applying urge suppression techniques in addition to HEP.  ASSESSMENT Patient presents to clinic with excellent motivation to participate in therapy. Patient demonstrates  deficits in PFM strength, PFM coordination, IAP management, posture, balance, and gait. Patient able to articulate basics of urge suppression during today's session and responded positively to educational interventions. Patient will benefit from continued skilled therapeutic intervention to address remaining deficits in PFM strength, PFM coordination, IAP management, posture, balance, and gait in order to increase function, and improve overall QOL.     PT Long Term Goals - 08/14/20 1746      PT LONG TERM GOAL #1   Title Patient will demonstrate independence with HEP in order to maximize therapeutic gains and improve carryover from physical therapy sessions to ADLs in the home and community.    Baseline IE: not initiated    Time 8    Period Weeks    Status New    Target Date 10/09/20      PT LONG TERM GOAL #2   Title Patient will demonstrate circumferential and sequential contraction of >4/5 MMT, > 6 sec hold x10 and 5 consecutive quick flicks with </= 10 min rest between testing bouts, and relaxation of the PFM coordinated with breath for improved management of intra-abdominal pressure and normal bowel and bladder function without the presence of pain nor incontinence in order to improve participation at home and in the community.    Baseline IE: not demonstrated    Time 8    Period Weeks    Status New    Target Date 10/09/20      PT LONG TERM GOAL #  3   Title Patient will report decreased urinary pad usage to less than 2/day in order to demonstrate improved PFM coordination, strength, and function for improved overall QOL.    Baseline IE: 4/day    Time 8    Period Weeks    Status New    Target Date 10/09/20      PT LONG TERM GOAL #4   Title Patient will demonstrate improved function as evidenced by a score of 55 on FOTO measure for full participation in activities at home and in the community.    Baseline IE: 41    Time 8    Period Weeks    Status New    Target Date 10/09/20                  Plan - 08/27/20 0957    Clinical Impression Statement Patient presents to clinic with excellent motivation to participate in therapy. Patient demonstrates deficits in PFM strength, PFM coordination, IAP management, posture, balance, and gait. Patient able to articulate basics of urge suppression during today's session and responded positively to educational interventions. Patient will benefit from continued skilled therapeutic intervention to address remaining deficits in PFM strength, PFM coordination, IAP management, posture, balance, and gait in order to increase function, and improve overall QOL.    Personal Factors and Comorbidities Age;Past/Current Experience;Time since onset of injury/illness/exacerbation;Comorbidity 3+    Comorbidities HLD, DM I, HTN, cystocele, lichen sclerosis, anxiety, depression, migraines    Examination-Activity Limitations Continence;Lift;Squat;Sleep;Bend    [employer] Activity;Shop;Other;Yard Work    Conservation officer, historic buildings Evolving/Moderate complexity    Rehab Potential Fair    PT Frequency 1x / week    PT Duration 8 weeks    PT Treatment/Interventions Cryotherapy;Moist Heat;ADLs/Self Care Home Management;Electrical Stimulation;Therapeutic exercise;Neuromuscular re-education;Therapeutic activities;Patient/family education;Manual techniques;Taping;Dry needling;Energy conservation;Compression bandaging;Scar mobilization    PT Next Visit Plan PFM assessment and training    PT Home Exercise Plan bladder diary    Consulted and Agree with Plan of Care Patient           Patient will benefit from skilled therapeutic intervention in order to improve the following deficits and impairments:  Abnormal gait, Decreased balance, Pain, Postural dysfunction, Obesity, Decreased strength, Decreased coordination, Decreased activity tolerance, Improper body mechanics, Decreased endurance  Visit Diagnosis: Muscle  weakness (generalized)  Other lack of coordination     Problem List There are no problems to display for this patient.  Sheria Lang PT, DPT [phone]  08/27/2020, 2:32 PM  [address] [facility] [address] [place], [place], [address] Phone: [phone]   Fax:  [phone]  Name: Amy Norman MRN: [id number] Date of Birth: 13-Jul-1956

## 2020-08-28 ENCOUNTER — Ambulatory Visit: Payer: BC Managed Care – PPO | Admitting: Physical Therapy

## 2020-08-30 ENCOUNTER — Encounter: Payer: BC Managed Care – PPO | Admitting: Physical Therapy

## 2020-09-04 ENCOUNTER — Encounter: Payer: Self-pay | Admitting: Physical Therapy

## 2020-09-04 ENCOUNTER — Ambulatory Visit: Payer: BC Managed Care – PPO | Admitting: Physical Therapy

## 2020-09-04 ENCOUNTER — Other Ambulatory Visit: Payer: Self-pay

## 2020-09-04 DIAGNOSIS — M6281 Muscle weakness (generalized): Secondary | ICD-10-CM | POA: Diagnosis not present

## 2020-09-04 DIAGNOSIS — R278 Other lack of coordination: Secondary | ICD-10-CM

## 2020-09-04 NOTE — Therapy (Signed)
[place] [facility] [address] [place], [place], [address] Phone: [phone]   Fax:  [phone]  Physical Therapy Treatment  Patient Details  Name: Amy Norman MRN: [id number] Date of Birth: January 18, 1956 Referring Provider (PT): McVey, R   Encounter Date: 09/04/2020   PT End of Session - 09/04/20 1013    Visit Number 4    Number of Visits 8    Date for PT Re-Evaluation 10/09/20    PT Start Time 1000    PT Stop Time 1055    PT Time Calculation (min) 55 min    Activity Tolerance Patient tolerated treatment well    Behavior During Therapy [facility] for tasks assessed/performed           Past Medical History:  Diagnosis Date   Hiatal hernia    Hiatal hernia    Hyperlipidemia     Past Surgical History:  Procedure Laterality Date   ABDOMINAL HYSTERECTOMY  2006   BREAST CYST ASPIRATION Bilateral    Negative   REDUCTION MAMMAPLASTY Bilateral 1998    There were no vitals filed for this visit.   Subjective Assessment - 09/04/20 1002    Subjective Patient notes everything has been going well. She tried to apply urge suppression when she returned from church. and was unable to control her bladder and did have an episode of UI. She does note she was able to store urine for about 4 hours.    Currently in Pain? No/denies           TREATMENT  Neuromuscular Re-education: [facility] Supine diaphragmatic breathing Supine TrA with coordinated breath Supine heel slides with TrA, coordinated breath Supine marches with TrA, coordinated breath Patient educated on pelvic organ prolapse and internal assessment components for assessing and addressing.  Patient educated throughout session on appropriate technique and form using multi-modal cueing, HEP, and activity modification. Patient articulated understanding and returned demonstration.  Patient Response to interventions: Comfortable with working on deep core  phase 1.  ASSESSMENT Patient presents to clinic with excellent motivation to participate in therapy. Patient demonstrates deficits in PFM strength, PFM coordination, IAP management, posture, balance, and gait. Patient able to perform phase 1 of deep core exercises during today's session and responded positively to educational interventions. Patient will benefit from continued skilled therapeutic intervention to address remaining deficits in PFM strength, PFM coordination, IAP management, posture, balance, and gait in order to increase function, and improve overall QOL.     PT Long Term Goals - 08/14/20 1746      PT LONG TERM GOAL #1   Title Patient will demonstrate independence with HEP in order to maximize therapeutic gains and improve carryover from physical therapy sessions to ADLs in the home and community.    Baseline IE: not initiated    Time 8    Period Weeks    Status New    Target Date 10/09/20      PT LONG TERM GOAL #2   Title Patient will demonstrate circumferential and sequential contraction of >4/5 MMT, > 6 sec hold x10 and 5 consecutive quick flicks with </= 10 min rest between testing bouts, and relaxation of the PFM coordinated with breath for improved management of intra-abdominal pressure and normal bowel and bladder function without the presence of pain nor incontinence in order to improve participation at home and in the community.    Baseline IE: not demonstrated    Time 8    Period Weeks  Status New    Target Date 10/09/20      PT LONG TERM GOAL #3   Title Patient will report decreased urinary pad usage to less than 2/day in order to demonstrate improved PFM coordination, strength, and function for improved overall QOL.    Baseline IE: 4/day    Time 8    Period Weeks    Status New    Target Date 10/09/20      PT LONG TERM GOAL #4   Title Patient will demonstrate improved function as evidenced by a score of 55 on FOTO measure for full participation in  activities at home and in the community.    Baseline IE: 41    Time 8    Period Weeks    Status New    Target Date 10/09/20                 Plan - 09/04/20 1013    Clinical Impression Statement Patient presents to clinic with excellent motivation to participate in therapy. Patient demonstrates deficits in PFM strength, PFM coordination, IAP management, posture, balance, and gait. Patient able to perform phase 1 of deep core exercises during today's session and responded positively to educational interventions. Patient will benefit from continued skilled therapeutic intervention to address remaining deficits in PFM strength, PFM coordination, IAP management, posture, balance, and gait in order to increase function, and improve overall QOL.    Personal Factors and Comorbidities Age;Past/Current Experience;Time since onset of injury/illness/exacerbation;Comorbidity 3+    Comorbidities HLD, DM I, HTN, cystocele, lichen sclerosis, anxiety, depression, migraines    Examination-Activity Limitations Continence;Lift;Squat;Sleep;Bend    [employer] Activity;Shop;Other;Yard Work    Conservation officer, historic buildings Evolving/Moderate complexity    Rehab Potential Fair    PT Frequency 1x / week    PT Duration 8 weeks    PT Treatment/Interventions Cryotherapy;Moist Heat;ADLs/Self Care Home Management;Electrical Stimulation;Therapeutic exercise;Neuromuscular re-education;Therapeutic activities;Patient/family education;Manual techniques;Taping;Dry needling;Energy conservation;Compression bandaging;Scar mobilization    PT Next Visit Plan PFM assessment and training    PT Home Exercise Plan bladder diary    Consulted and Agree with Plan of Care Patient           Patient will benefit from skilled therapeutic intervention in order to improve the following deficits and impairments:  Abnormal gait, Decreased balance, Pain, Postural dysfunction, Obesity, Decreased  strength, Decreased coordination, Decreased activity tolerance, Improper body mechanics, Decreased endurance  Visit Diagnosis: Muscle weakness (generalized)  Other lack of coordination     Problem List There are no problems to display for this patient.  Sheria Lang PT, DPT [phone]  09/04/2020, 12:49 PM  [place] [facility] [address] [place], [place], [address] Phone: [phone]   Fax:  [phone]  Name: CLAIRA JETER MRN: [id number] Date of Birth: 15-Nov-1956

## 2020-09-06 ENCOUNTER — Encounter: Payer: BC Managed Care – PPO | Admitting: Physical Therapy

## 2020-09-11 ENCOUNTER — Ambulatory Visit: Payer: BC Managed Care – PPO | Admitting: Physical Therapy

## 2020-09-11 ENCOUNTER — Encounter: Payer: Self-pay | Admitting: Physical Therapy

## 2020-09-11 ENCOUNTER — Other Ambulatory Visit: Payer: Self-pay

## 2020-09-11 DIAGNOSIS — M6281 Muscle weakness (generalized): Secondary | ICD-10-CM

## 2020-09-11 DIAGNOSIS — R278 Other lack of coordination: Secondary | ICD-10-CM

## 2020-09-11 NOTE — Therapy (Signed)
[facility] [address] [place], [place], [address] Phone: [phone]   Fax:  [phone]  Physical Therapy Treatment  Patient Details  Name: Amy Norman MRN: [id number] Date of Birth: [birth year] Referring Provider (PT): McVey, [place]   Encounter Date: 09/11/2020   PT End of Session - 09/11/20 1006    Visit Number 5    Number of Visits 8    Date for PT Re-Evaluation 10/09/20    PT Start Time 1000    PT Stop Time 1055    PT Time Calculation (min) 55 min    Activity Tolerance Patient tolerated treatment well    Behavior During Therapy [facility] for tasks assessed/performed           Past Medical History:  Diagnosis Date  . Hiatal hernia   . Hiatal hernia   . Hyperlipidemia     Past Surgical History:  Procedure Laterality Date  . ABDOMINAL HYSTERECTOMY  2006  . BREAST CYST ASPIRATION Bilateral    Negative  . REDUCTION MAMMAPLASTY Bilateral 1998    There were no vitals filed for this visit.   Subjective Assessment - 09/11/20 1003    Subjective Patient reports that she has been applying some urge suppression techniques with good success. She also notes that she is not storing urine beyond 3 hours. Patient did try to do exercises on the floor and had increased back pain.    Currently in Pain? No/denies          TREATMENT Manual Therapy: STM and TPR performed to B thoracolumbar mm to allow for decreased tension and pain and improved posture and function with vibratory and percussive instrument  Neuromuscular Re-education: Supine hooklying diaphragmatic breathing with VCs and TCs for downregulation of the nervous system and improved management of IAP Supine hooklying, PFM contractions with exhalation. VCs and TCs to decrease compensatory patterns and encourage activation of the PFM. Updated HEP and discussed supported hips elevated posture for improved bladder position and activated PFM. Patient education on use of e-stim  for PFM stimulation and improved motor control. INTERNAL VAGINAL EXAM: Patient educated on the purpose of the pelvic exam and articulated understanding; patient consented to the exam verbally. Introitus Appears: WNL Skin integrity: no erythema, edema, atrophy noted Strength (PERF): 2/5, Endurance: 2 sec, Repetitions: 3, Fast twitch: too fatigued to assess Symmetry: R dominant, L side slow to respond (1/5) Palpation: no TTP Prolapse: to the vaginal opening     Patient educated throughout session on appropriate technique and form using multi-modal cueing, HEP, and activity modification. Patient articulated understanding and returned demonstration.  Patient Response to interventions: Comfortable to perform PFM strengthening and return in 2 weeks.  ASSESSMENT Patient presents to clinic with excellent motivation to participate in therapy. Patient demonstrates deficits in PFM strength, PFM coordination, IAP management, posture, balance, and gait. Patient with 2/5 MMT PFM strength on internal examination with asymmetry in muscle contraction (R>L) during today's session and responded positively to educational interventions. Patient will benefit from continued skilled therapeutic intervention to address remaining deficits in PFM strength, PFM coordination, IAP management, posture, balance, and gait in order to increase function, and improve overall QOL.     PT Long Term Goals - 08/14/20 1746      PT LONG TERM GOAL #1   Title Patient will demonstrate independence with HEP in order to maximize therapeutic gains and improve carryover from physical therapy sessions to ADLs in the home and community.    Baseline  IE: not initiated    Time 8    Period Weeks    Status New    Target Date 10/09/20      PT LONG TERM GOAL #2   Title Patient will demonstrate circumferential and sequential contraction of >4/5 MMT, > 6 sec hold x10 and 5 consecutive quick flicks with </= 10 min rest between testing bouts,  and relaxation of the PFM coordinated with breath for improved management of intra-abdominal pressure and normal bowel and bladder function without the presence of pain nor incontinence in order to improve participation at home and in the community.    Baseline IE: not demonstrated    Time 8    Period Weeks    Status New    Target Date 10/09/20      PT LONG TERM GOAL #3   Title Patient will report decreased urinary pad usage to less than 2/day in order to demonstrate improved PFM coordination, strength, and function for improved overall QOL.    Baseline IE: 4/day    Time 8    Period Weeks    Status New    Target Date 10/09/20      PT LONG TERM GOAL #4   Title Patient will demonstrate improved function as evidenced by a score of 55 on FOTO measure for full participation in activities at home and in the community.    Baseline IE: 41    Time 8    Period Weeks    Status New    Target Date 10/09/20                 Plan - 09/11/20 1006    Clinical Impression Statement Patient presents to clinic with excellent motivation to participate in therapy. Patient demonstrates deficits in PFM strength, PFM coordination, IAP management, posture, balance, and gait. Patient with 2/5 MMT PFM strength on internal examination with asymmetry in muscle contraction (R>L) during today's session and responded positively to educational interventions. Patient will benefit from continued skilled therapeutic intervention to address remaining deficits in PFM strength, PFM coordination, IAP management, posture, balance, and gait in order to increase function, and improve overall QOL.    Personal Factors and Comorbidities Age;Past/Current Experience;Time since onset of injury/illness/exacerbation;Comorbidity 3+    Comorbidities HLD, DM I, HTN, cystocele, lichen sclerosis, anxiety, depression, migraines    Examination-Activity Limitations Continence;Lift;Squat;Sleep;Bend    [employer] Activity;Shop;Other;Yard Work    Conservation officer, historic buildings Evolving/Moderate complexity    Rehab Potential Fair    PT Frequency 1x / week    PT Duration 8 weeks    PT Treatment/Interventions Cryotherapy;Moist Heat;ADLs/Self Care Home Management;Electrical Stimulation;Therapeutic exercise;Neuromuscular re-education;Therapeutic activities;Patient/family education;Manual techniques;Taping;Dry needling;Energy conservation;Compression bandaging;Scar mobilization    PT Next Visit Plan PFM assessment and training    PT Home Exercise Plan bladder diary    Consulted and Agree with Plan of Care Patient           Patient will benefit from skilled therapeutic intervention in order to improve the following deficits and impairments:  Abnormal gait, Decreased balance, Pain, Postural dysfunction, Obesity, Decreased strength, Decreased coordination, Decreased activity tolerance, Improper body mechanics, Decreased endurance  Visit Diagnosis: Muscle weakness (generalized)  Other lack of coordination     Problem List There are no problems to display for this patient.  Sheria Lang PT, DPT [phone]  09/11/2020, 6:04 PM  [place] [facility] [address] [place], [place], [address] Phone: [phone]   Fax:  [phone]  Name: Amy Norman MRN: [id number] Date of Birth: August 14, 1956

## 2020-09-13 ENCOUNTER — Encounter: Payer: BC Managed Care – PPO | Admitting: Physical Therapy

## 2020-09-18 ENCOUNTER — Ambulatory Visit: Payer: BC Managed Care – PPO | Attending: Obstetrics and Gynecology | Admitting: Physical Therapy

## 2020-09-18 ENCOUNTER — Other Ambulatory Visit: Payer: Self-pay

## 2020-09-18 ENCOUNTER — Encounter: Payer: Self-pay | Admitting: Physical Therapy

## 2020-09-18 DIAGNOSIS — R278 Other lack of coordination: Secondary | ICD-10-CM

## 2020-09-18 DIAGNOSIS — M6281 Muscle weakness (generalized): Secondary | ICD-10-CM | POA: Diagnosis not present

## 2020-09-18 NOTE — Therapy (Signed)
[facility] [address] [place], [place], [address] Phone: [phone]   Fax:  [phone]  Physical Therapy Treatment  Patient Details  Name: Amy Norman MRN: [id number] Date of Birth: Nov 26, 1955 Referring Provider (PT): [place], [place]   Encounter Date: 09/18/2020   PT End of Session - 09/18/20 1321    Visit Number 6    Number of Visits 8    Date for PT Re-Evaluation 10/09/20    PT Start Time 0950    PT Stop Time 1045    PT Time Calculation (min) 55 min    Activity Tolerance Patient tolerated treatment well    Behavior During Therapy [facility] for tasks assessed/performed           Past Medical History:  Diagnosis Date   Hiatal hernia    Hiatal hernia    Hyperlipidemia     Past Surgical History:  Procedure Laterality Date   ABDOMINAL HYSTERECTOMY  2006   BREAST CYST ASPIRATION Bilateral    Negative   REDUCTION MAMMAPLASTY Bilateral 1998    There were no vitals filed for this visit.   Subjective Assessment - 09/18/20 0953    Subjective Patient notes that she had a nice impromptu trip to the mountains. Patient notes that she didn't have any trouble with the drive and urinary urgency and was even able to hike without issue. Patient notes that she did have 2 incidents at home when gardening and in the middle of the night where she didn't quite make it to the bathroom in the presence of strong urge.    Currently in Pain? No/denies            TREATMENT  Neuromuscular Re-education: Supine hooklying diaphragmatic breathing with VCs and TCs for downregulation of the nervous system and improved management of IAP Supine hooklying, PFM contractions with exhalation during NMES to distal tibial nerve (20Hz , 300usec, 4 on; 8 off). VCs and TCs to decrease compensatory patterns and encourage activation of the PFM. Supine PFM contractions with hip adduction isometric and forced expiration for improved PFM coordination. Patient  education on e-stim protocol, benefits, and theory of stimulating S2-4 nerve roots via a distal nerve for improved activation of nerve fibers for PFM recruitment benefit.    Patient educated throughout session on appropriate technique and form using multi-modal cueing, HEP, and activity modification. Patient articulated understanding and returned demonstration.  Patient Response to interventions: Comfortable to perform PFM strengthening and return in 2 weeks.   ASSESSMENT Patient presents to clinic with excellent motivation to participate in therapy. Patient demonstrates deficits in PFM strength, PFM coordination, IAP management, posture, balance, and gait. Patient better able to coordinate PFM contraction in supine with hip adduction isometric during today's session and responded positively to educational interventions. Patient will benefit from continued skilled therapeutic intervention to address remaining deficits in PFM strength, PFM coordination, IAP management, posture, balance, and gait in order to increase function, and improve overall QOL.     PT Long Term Goals - 08/14/20 1746      PT LONG TERM GOAL #1   Title Patient will demonstrate independence with HEP in order to maximize therapeutic gains and improve carryover from physical therapy sessions to ADLs in the home and community.    Baseline IE: not initiated    Time 8    Period Weeks    Status New    Target Date 10/09/20      PT LONG TERM GOAL #2  Title Patient will demonstrate circumferential and sequential contraction of >4/5 MMT, > 6 sec hold x10 and 5 consecutive quick flicks with </= 10 min rest between testing bouts, and relaxation of the PFM coordinated with breath for improved management of intra-abdominal pressure and normal bowel and bladder function without the presence of pain nor incontinence in order to improve participation at home and in the community.    Baseline IE: not demonstrated    Time 8    Period  Weeks    Status New    Target Date 10/09/20      PT LONG TERM GOAL #3   Title Patient will report decreased urinary pad usage to less than 2/day in order to demonstrate improved PFM coordination, strength, and function for improved overall QOL.    Baseline IE: 4/day    Time 8    Period Weeks    Status New    Target Date 10/09/20      PT LONG TERM GOAL #4   Title Patient will demonstrate improved function as evidenced by a score of 55 on FOTO measure for full participation in activities at home and in the community.    Baseline IE: 41    Time 8    Period Weeks    Status New    Target Date 10/09/20                 Plan - 09/18/20 1320    Clinical Impression Statement Patient presents to clinic with excellent motivation to participate in therapy. Patient demonstrates deficits in PFM strength, PFM coordination, IAP management, posture, balance, and gait. Patient better able to coordinate PFM contraction in supine with hip adduction isometric during today's session and responded positively to educational interventions. Patient will benefit from continued skilled therapeutic intervention to address remaining deficits in PFM strength, PFM coordination, IAP management, posture, balance, and gait in order to increase function, and improve overall QOL.    Personal Factors and Comorbidities Age;Past/Current Experience;Time since onset of injury/illness/exacerbation;Comorbidity 3+    Comorbidities HLD, DM I, HTN, cystocele, lichen sclerosis, anxiety, depression, migraines    Examination-Activity Limitations Continence;Lift;Squat;Sleep;Bend    [employer] Activity;Shop;Other;Yard Work    Conservation officer, historic buildings Evolving/Moderate complexity    Rehab Potential Fair    PT Frequency 1x / week    PT Duration 8 weeks    PT Treatment/Interventions Cryotherapy;Moist Heat;ADLs/Self Care Home Management;Electrical Stimulation;Therapeutic  exercise;Neuromuscular re-education;Therapeutic activities;Patient/family education;Manual techniques;Taping;Dry needling;Energy conservation;Compression bandaging;Scar mobilization    PT Next Visit Plan PFM assessment and training    PT Home Exercise Plan bladder diary    Consulted and Agree with Plan of Care Patient           Patient will benefit from skilled therapeutic intervention in order to improve the following deficits and impairments:  Abnormal gait, Decreased balance, Pain, Postural dysfunction, Obesity, Decreased strength, Decreased coordination, Decreased activity tolerance, Improper body mechanics, Decreased endurance  Visit Diagnosis: Muscle weakness (generalized)  Other lack of coordination     Problem List There are no problems to display for this patient.  Sheria Lang PT, DPT [phone]  09/18/2020, 1:21 PM  [facility] [address] [place], [place], [address] Phone: [phone]   Fax:  [phone]  Name: Amy Norman MRN: [id number] Date of Birth: [birth year]

## 2020-09-20 ENCOUNTER — Encounter: Payer: BC Managed Care – PPO | Admitting: Physical Therapy

## 2020-09-27 ENCOUNTER — Encounter: Payer: BC Managed Care – PPO | Admitting: Physical Therapy

## 2020-10-04 ENCOUNTER — Encounter: Payer: BC Managed Care – PPO | Admitting: Physical Therapy

## 2020-10-04 ENCOUNTER — Ambulatory Visit: Payer: BC Managed Care – PPO | Admitting: Physical Therapy

## 2020-10-18 ENCOUNTER — Encounter: Payer: BC Managed Care – PPO | Admitting: Physical Therapy

## 2020-10-18 ENCOUNTER — Ambulatory Visit: Payer: BC Managed Care – PPO | Admitting: Physical Therapy

## 2020-10-25 ENCOUNTER — Encounter: Payer: BC Managed Care – PPO | Admitting: Physical Therapy

## 2020-11-01 ENCOUNTER — Encounter: Payer: BC Managed Care – PPO | Admitting: Physical Therapy

## 2020-11-08 ENCOUNTER — Encounter: Payer: BC Managed Care – PPO | Admitting: Physical Therapy

## 2020-11-15 ENCOUNTER — Encounter: Payer: BC Managed Care – PPO | Admitting: Physical Therapy

## 2020-12-25 ENCOUNTER — Other Ambulatory Visit: Payer: Self-pay | Admitting: Internal Medicine

## 2021-03-27 ENCOUNTER — Other Ambulatory Visit: Payer: Self-pay | Admitting: Internal Medicine

## 2021-03-27 DIAGNOSIS — Z1231 Encounter for screening mammogram for malignant neoplasm of breast: Secondary | ICD-10-CM

## 2022-02-24 ENCOUNTER — Other Ambulatory Visit: Payer: Self-pay | Admitting: Internal Medicine

## 2022-02-24 DIAGNOSIS — Z1231 Encounter for screening mammogram for malignant neoplasm of breast: Secondary | ICD-10-CM

## 2022-03-25 ENCOUNTER — Other Ambulatory Visit: Payer: Self-pay | Admitting: Internal Medicine

## 2022-03-25 DIAGNOSIS — R0602 Shortness of breath: Secondary | ICD-10-CM

## 2022-03-25 DIAGNOSIS — R9431 Abnormal electrocardiogram [ECG] [EKG]: Secondary | ICD-10-CM

## 2022-03-28 ENCOUNTER — Ambulatory Visit
Admission: RE | Admit: 2022-03-28 | Discharge: 2022-03-28 | Disposition: A | Discharge status: Home / Self Care | Payer: Medicare Other | Source: Ambulatory Visit | Attending: Internal Medicine | Admitting: Internal Medicine

## 2022-03-28 DIAGNOSIS — Z1231 Encounter for screening mammogram for malignant neoplasm of breast: Secondary | ICD-10-CM | POA: Insufficient documentation

## 2022-04-02 ENCOUNTER — Ambulatory Visit
Admission: RE | Admit: 2022-04-02 | Discharge: 2022-04-02 | Disposition: A | Discharge status: Home / Self Care | Payer: BC Managed Care – PPO | Source: Ambulatory Visit | Attending: Internal Medicine | Admitting: Internal Medicine

## 2022-04-02 DIAGNOSIS — R0602 Shortness of breath: Secondary | ICD-10-CM | POA: Insufficient documentation

## 2022-04-02 DIAGNOSIS — R9431 Abnormal electrocardiogram [ECG] [EKG]: Secondary | ICD-10-CM | POA: Insufficient documentation

## 2023-02-12 ENCOUNTER — Other Ambulatory Visit: Payer: Self-pay | Admitting: Internal Medicine

## 2023-02-12 DIAGNOSIS — Z1231 Encounter for screening mammogram for malignant neoplasm of breast: Secondary | ICD-10-CM

## 2023-04-01 ENCOUNTER — Ambulatory Visit
Admission: RE | Admit: 2023-04-01 | Discharge: 2023-04-01 | Disposition: A | Discharge status: Home / Self Care | Payer: Medicare Other | Source: Ambulatory Visit | Attending: Internal Medicine | Admitting: Internal Medicine

## 2023-04-01 DIAGNOSIS — Z1231 Encounter for screening mammogram for malignant neoplasm of breast: Secondary | ICD-10-CM | POA: Diagnosis present

## 2024-02-20 IMAGING — MG MM DIGITAL SCREENING BILAT W/ TOMO AND CAD
8 series · 8 of 24 positions shown · non-contrast
Comparison: Previous exam(s).

CLINICAL DATA: Screening.

EXAM:
DIGITAL SCREENING BILATERAL MAMMOGRAM WITH TOMOSYNTHESIS AND CAD
TECHNIQUE: Bilateral screening digital craniocaudal and mediolateral oblique
mammograms were obtained. Bilateral screening digital breast
tomosynthesis was performed. The images were evaluated with
computer-aided detection.

[R MLO synth-2D]
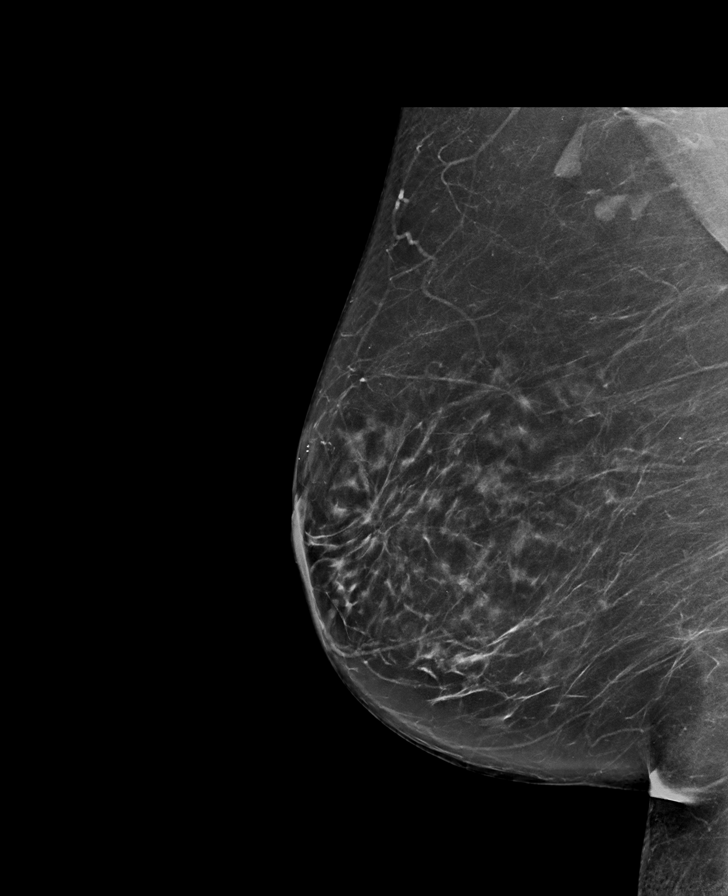

[L CC synth-2D]
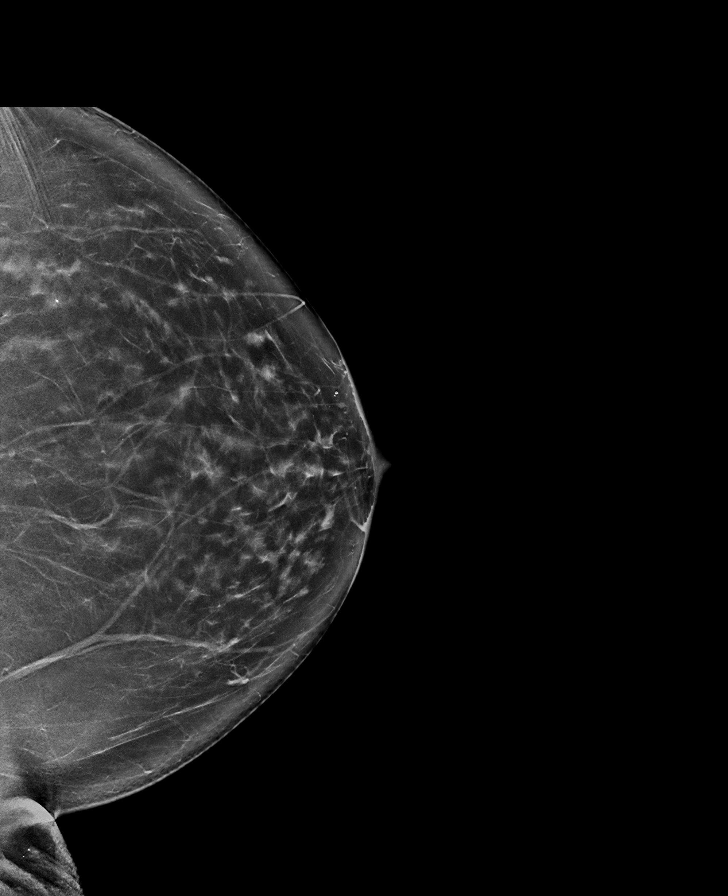

[L MLO synth-2D]
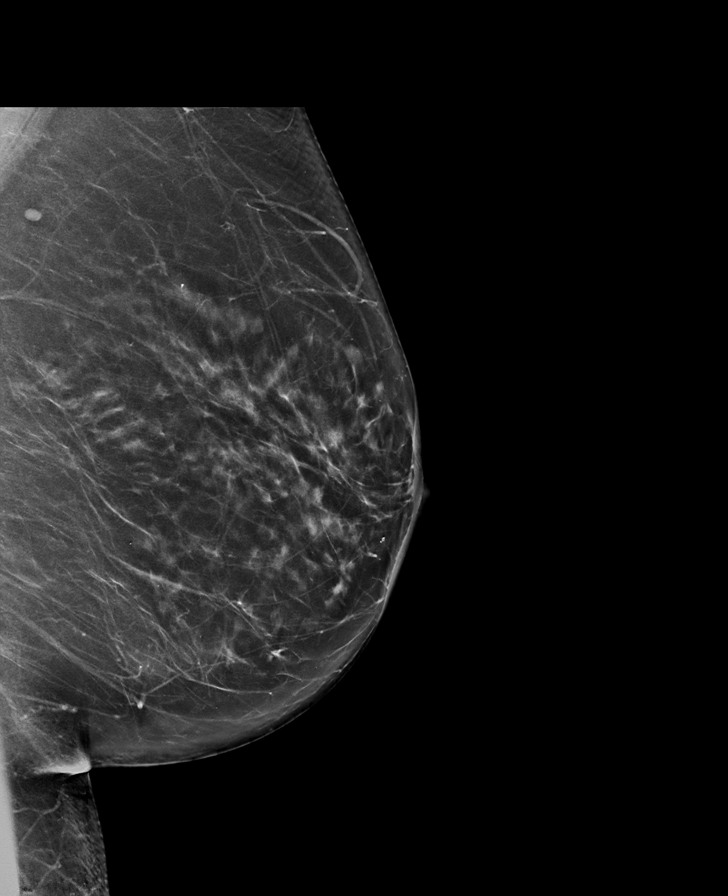

[R CC synth-2D]
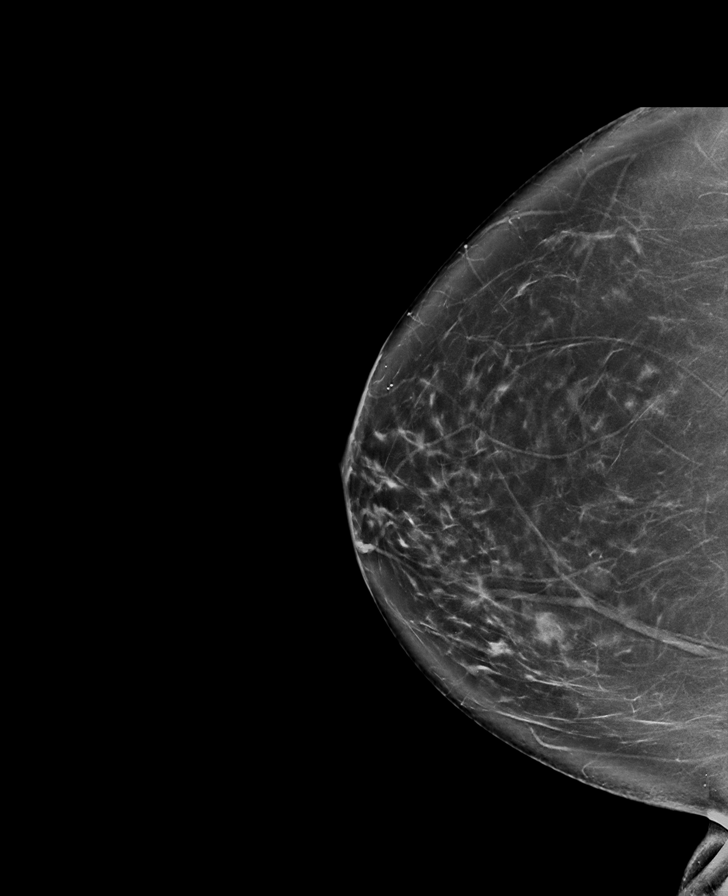

[R MLO tomo · tomo slice 43/85.0]
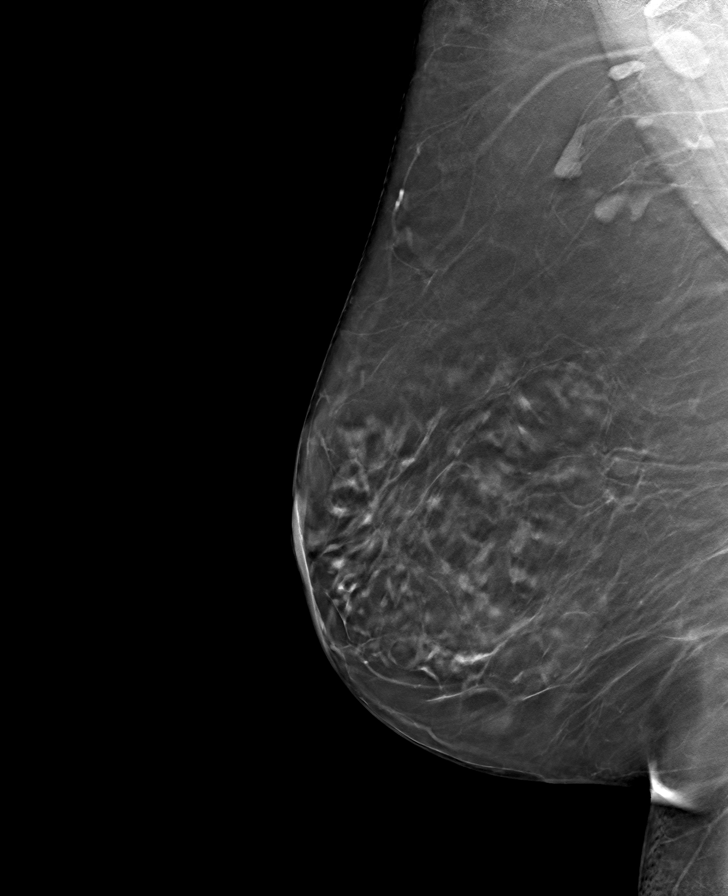

[L CC tomo · tomo slice 41/81.0]
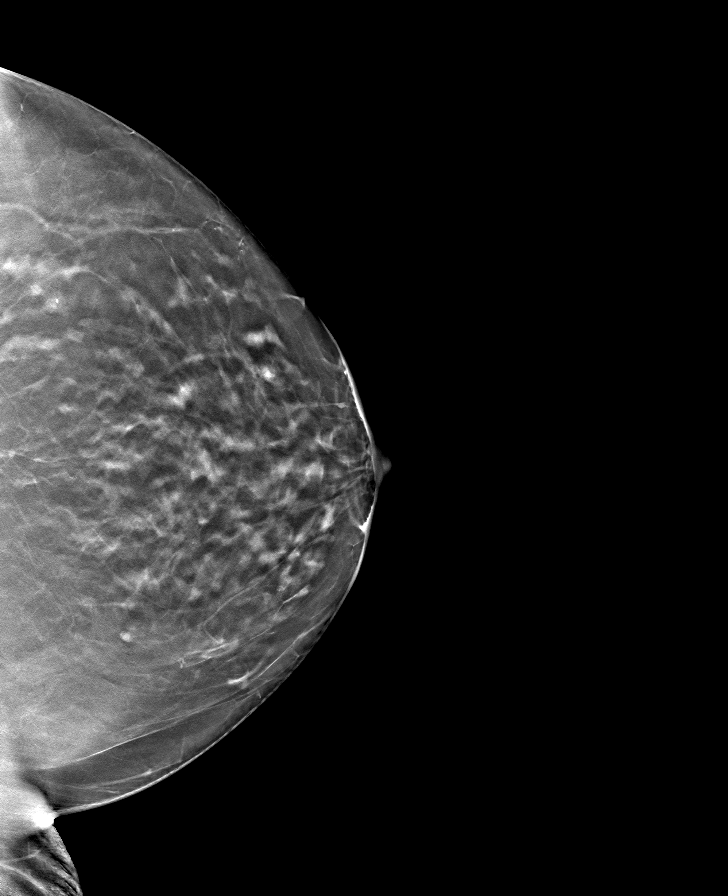

[L MLO tomo · tomo slice 41/81.0]
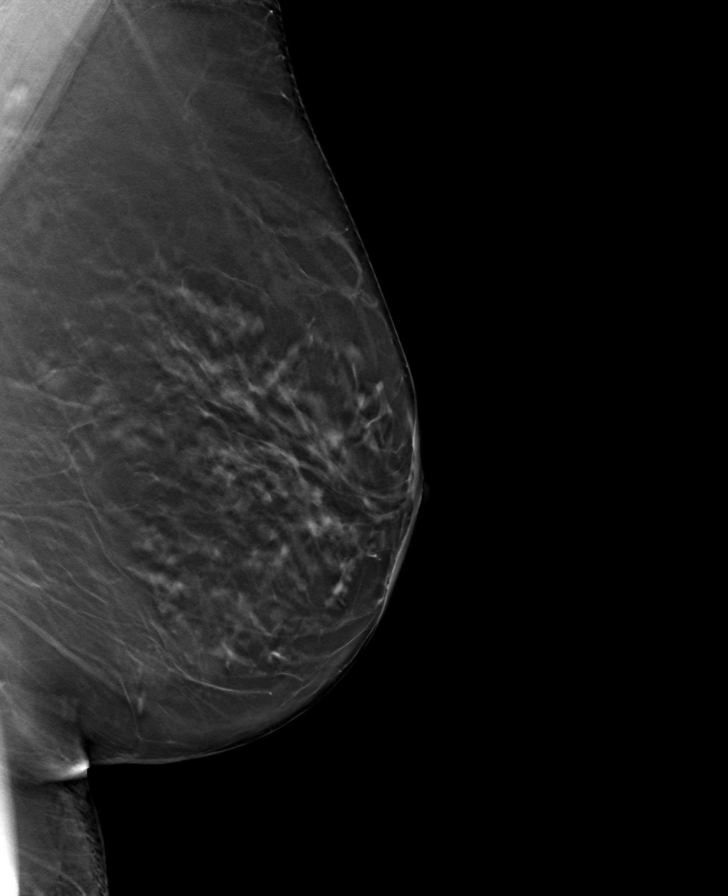

[R CC tomo · tomo slice 43/84.0]
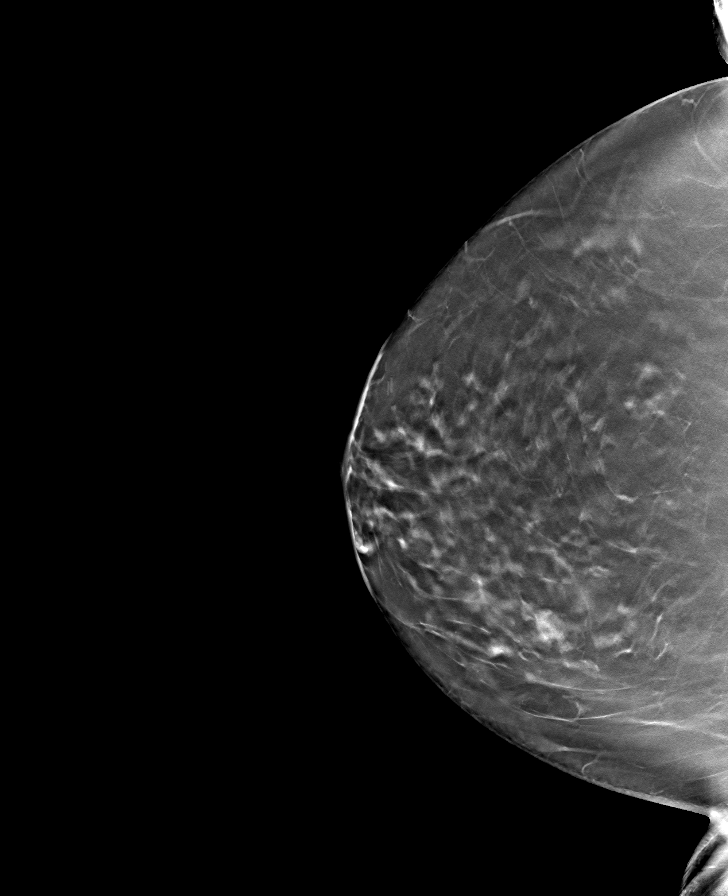

[8 of 24 positions shown; findings below may reference images not displayed]

ACR Breast Density Category b: There are scattered areas of
fibroglandular density.
FINDINGS: There are no findings suspicious for malignancy.
IMPRESSION: No mammographic evidence of malignancy. A result letter of this
screening mammogram will be mailed directly to the patient.

RECOMMENDATION:
Screening mammogram in one year. (Code:51-O-LD2)

BI-RADS CATEGORY  1: Negative.

## 2024-02-25 IMAGING — CT CT CARDIAC CORONARY ARTERY CALCIUM SCORE
3 series · 14 of 20 positions shown, 16 images · non-contrast
Comparison: No priors.
COMPARISON: No priors.

Addendum:
CLINICAL DATA: This over-read does not include interpretation of
cardiac or coronary anatomy or pathology. The coronary calcium score
interpretation by the cardiologist is attached.
CLINICAL DATA: Risk stratification

EXAM:
Coronary Calcium Score
TECHNIQUE: The patient was scanned on a Siemens Somatom go.Top Scanner. Axial
non-contrast 3 mm slices were carried out through the heart. The
data set was analyzed on a dedicated work station and scored using
the Agatson method.

[Series 2: sa36 calcium scoring 3.00 · axial · 0.33mm/px · z∈[-1141,-1060]mm · 4 of 46 slices shown]
[im 10/46  vessel]
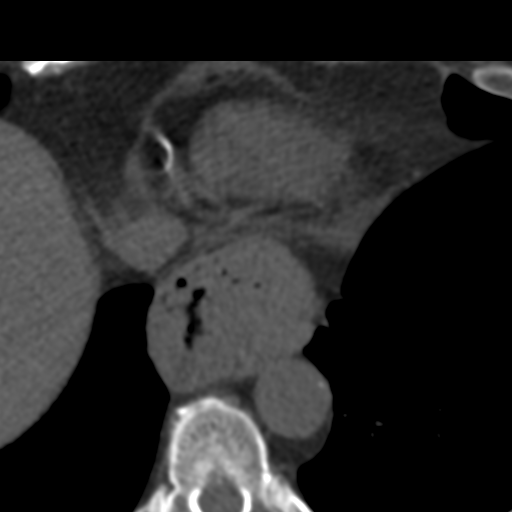
[im 19/46  vessel]
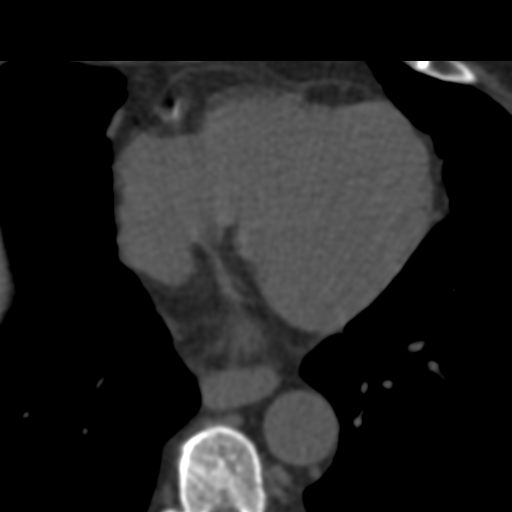
[im 28/46  vessel]
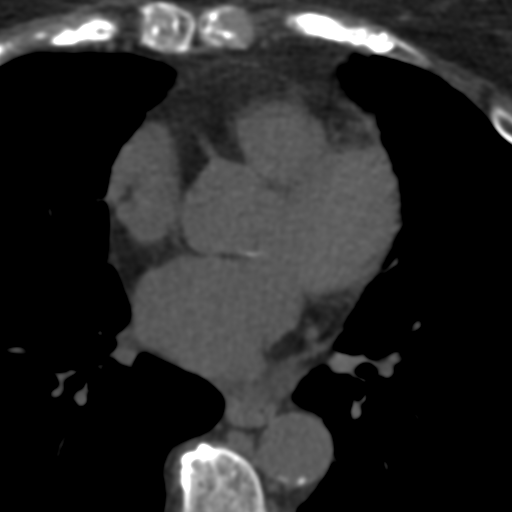
[im 37/46  vessel]
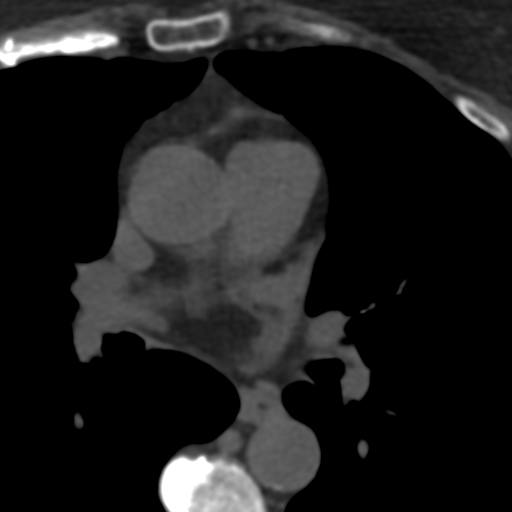

[Series 5: full fov st calcium scoring 3.00 · axial · 0.61mm/px · z∈[-1147,-1057]mm · 5 of 46 slices shown, 7 images]
[im 8/46  vessel]
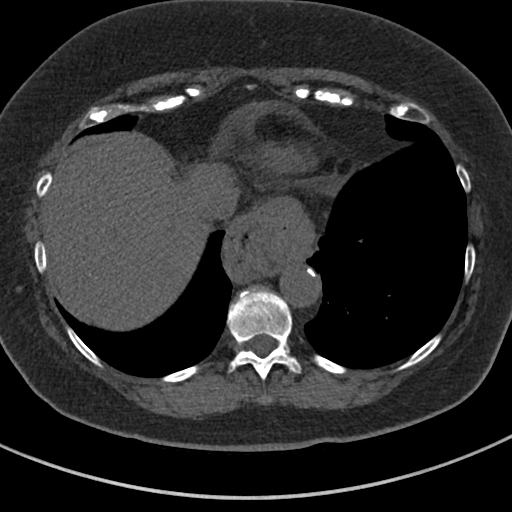
[im 8/46  lung]
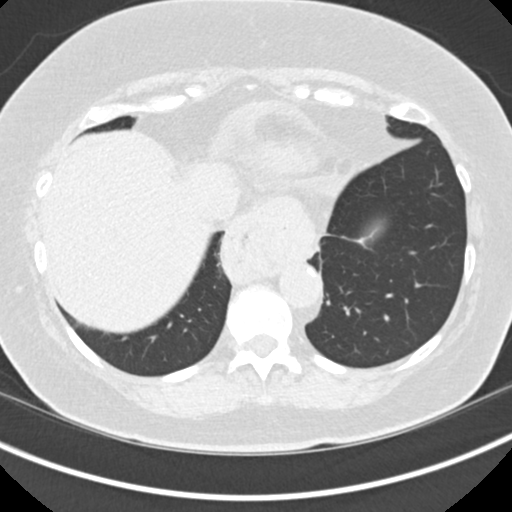
[im 16/46  vessel]
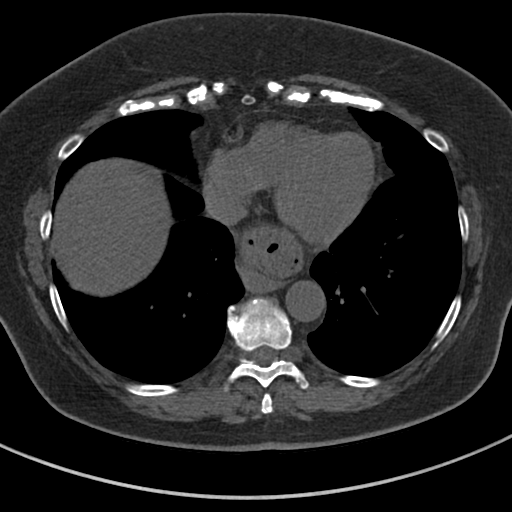
[im 23/46  vessel]
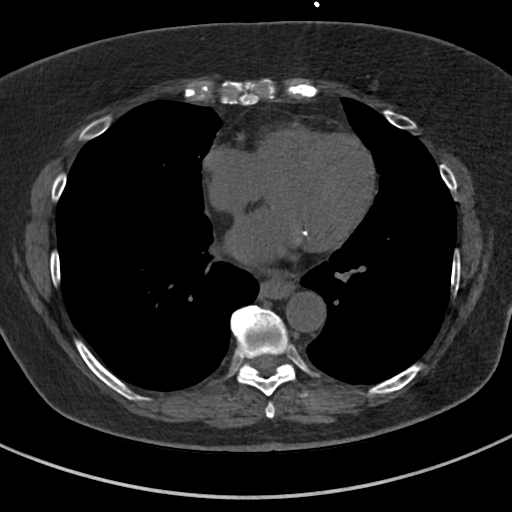
[im 31/46  vessel]
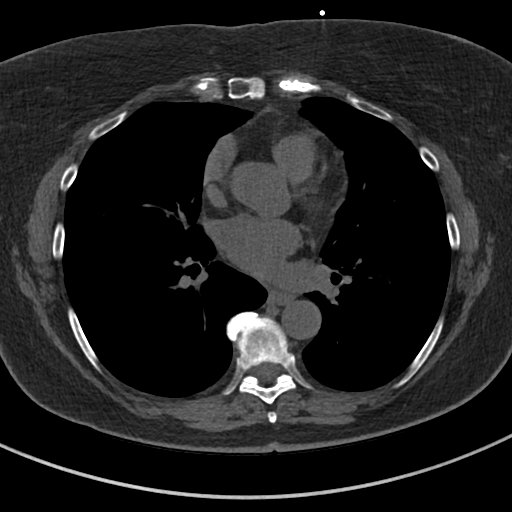
[im 38/46  vessel]
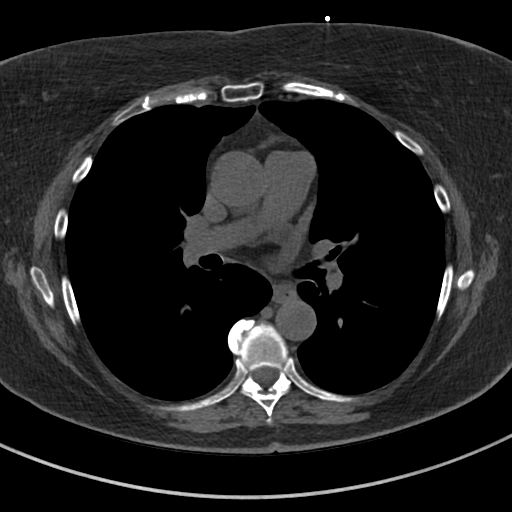
[im 38/46  lung]
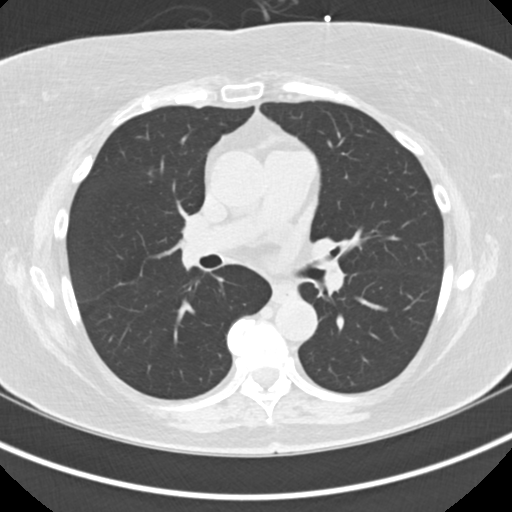

[Series 10: full fov lungs calcium scoring 3.00 ax · axial · 0.61mm/px · z∈[-1147,-1057]mm · 5 of 46 slices shown]
[im 8/46  vessel]
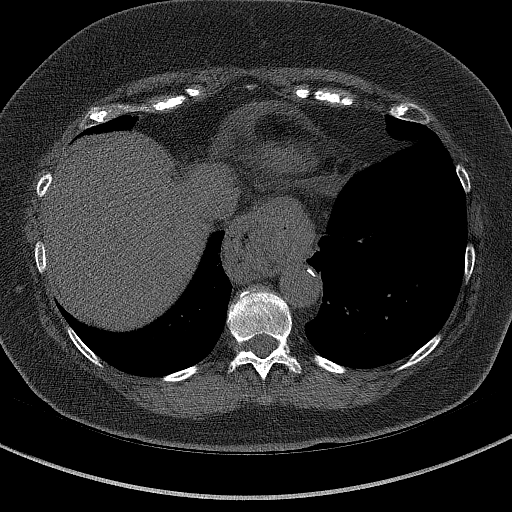
[im 16/46  vessel]
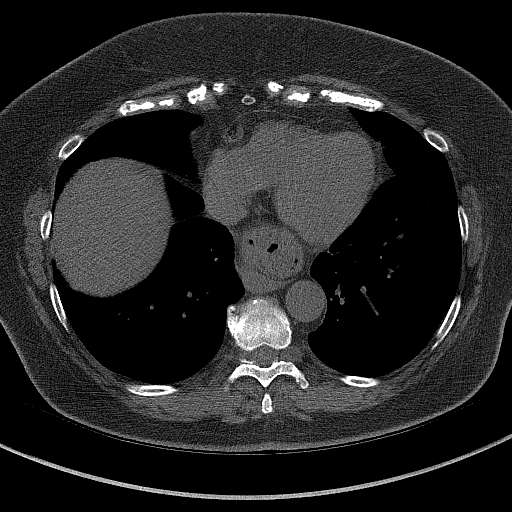
[im 23/46  vessel]
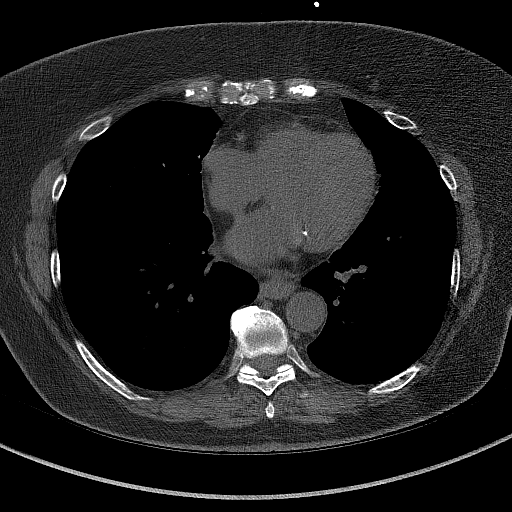
[im 31/46  vessel]
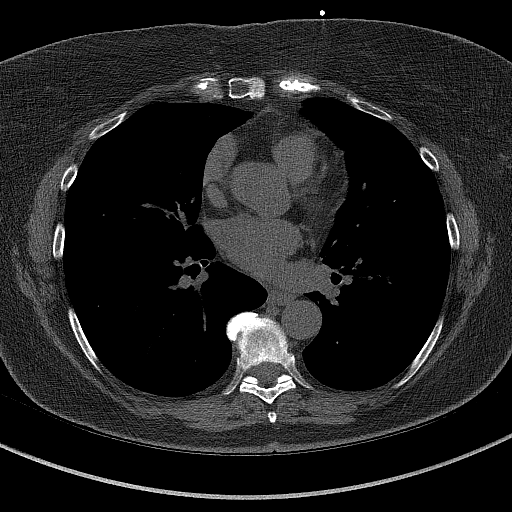
[im 38/46  vessel]
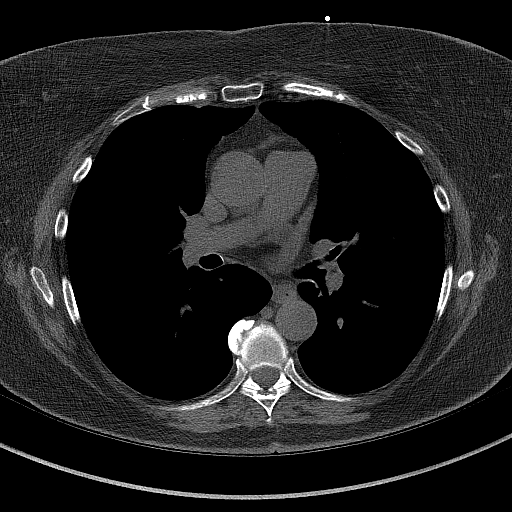

[14 of 20 positions shown; findings below may reference images not displayed]

FINDINGS: Aortic atherosclerosis. Moderate-sized hiatal hernia. Within the
visualized portions of the thorax there are no suspicious appearing
pulmonary nodules or masses, there is no acute consolidative
airspace disease, no pleural effusions, no pneumothorax and no
lymphadenopathy. Visualized portions of the upper abdomen are
unremarkable. There are no aggressive appearing lytic or blastic
lesions noted in the visualized portions of the skeleton.
IMPRESSION: 1.  Aortic Atherosclerosis (G052F-B9X.X).
2. Moderate-sized hiatal hernia.
FINDINGS: Non-cardiac: See separate report from [REDACTED].

Ascending Aorta: Normal size

Pericardium: Normal

Coronary arteries: Normal origin of left and right coronary
arteries. Distribution of arterial calcifications if present, as
noted below;

LM 0

LAD 194

LCx

RCA 113

Total 330

IMPRESSION AND RECOMMENDATION:
1. Coronary calcium score of 330. This was 93rd percentile for age
and sex matched control.

2. CAC >300 in LAD, LCx, RCA. KARGER CHATO3/N3.

3. Recommend aspirin and statin if no contraindications.

4. Recommend cardiology consultation.

5. Continue heart healthy lifestyle and risk factor modification.

*** End of Addendum ***
FINDINGS: Aortic atherosclerosis. Moderate-sized hiatal hernia. Within the
visualized portions of the thorax there are no suspicious appearing
pulmonary nodules or masses, there is no acute consolidative
airspace disease, no pleural effusions, no pneumothorax and no
lymphadenopathy. Visualized portions of the upper abdomen are
unremarkable. There are no aggressive appearing lytic or blastic
lesions noted in the visualized portions of the skeleton.
IMPRESSION: 1.  Aortic Atherosclerosis (G052F-B9X.X).
2. Moderate-sized hiatal hernia.

## 2024-06-08 ENCOUNTER — Other Ambulatory Visit: Payer: Self-pay | Admitting: Internal Medicine

## 2024-06-08 DIAGNOSIS — Z1231 Encounter for screening mammogram for malignant neoplasm of breast: Secondary | ICD-10-CM

## 2024-06-23 ENCOUNTER — Ambulatory Visit
Admission: RE | Admit: 2024-06-23 | Discharge: 2024-06-23 | Disposition: A | Discharge status: Home / Self Care | Source: Ambulatory Visit | Attending: Internal Medicine | Admitting: Internal Medicine

## 2024-06-23 DIAGNOSIS — Z1231 Encounter for screening mammogram for malignant neoplasm of breast: Secondary | ICD-10-CM | POA: Insufficient documentation

## 2024-06-30 ENCOUNTER — Ambulatory Visit
Admission: RE | Admit: 2024-06-30 | Discharge: 2024-06-30 | Disposition: A | Discharge status: Home / Self Care | Source: Ambulatory Visit | Attending: Internal Medicine | Admitting: Internal Medicine

## 2024-06-30 DIAGNOSIS — Z1231 Encounter for screening mammogram for malignant neoplasm of breast: Secondary | ICD-10-CM | POA: Diagnosis present
